# Patient Record
Sex: Male | Born: 1985 | Race: Black or African American | Hispanic: No | Marital: Single | Smoking: Never smoker
Health system: Southern US, Community
[De-identification: ages and names within clinical notes are randomized; demographics above are authoritative.]

## PROBLEM LIST (undated history)

## (undated) DIAGNOSIS — G8190 Hemiplegia, unspecified affecting unspecified side: Secondary | ICD-10-CM

## (undated) DIAGNOSIS — S069XAA Unspecified intracranial injury with loss of consciousness status unknown, initial encounter: Secondary | ICD-10-CM

## (undated) DIAGNOSIS — I10 Essential (primary) hypertension: Secondary | ICD-10-CM

## (undated) DIAGNOSIS — S069X9A Unspecified intracranial injury with loss of consciousness of unspecified duration, initial encounter: Secondary | ICD-10-CM

## (undated) DIAGNOSIS — R569 Unspecified convulsions: Secondary | ICD-10-CM

---

## 2018-02-19 DIAGNOSIS — T1490XA Injury, unspecified, initial encounter: Secondary | ICD-10-CM | POA: Insufficient documentation

## 2018-02-19 DIAGNOSIS — W3400XA Accidental discharge from unspecified firearms or gun, initial encounter: Secondary | ICD-10-CM | POA: Insufficient documentation

## 2018-02-20 DIAGNOSIS — S020XXB Fracture of vault of skull, initial encounter for open fracture: Secondary | ICD-10-CM | POA: Insufficient documentation

## 2018-02-20 DIAGNOSIS — G9389 Other specified disorders of brain: Secondary | ICD-10-CM | POA: Insufficient documentation

## 2018-02-20 DIAGNOSIS — S065XAA Traumatic subdural hemorrhage with loss of consciousness status unknown, initial encounter: Secondary | ICD-10-CM | POA: Insufficient documentation

## 2018-02-20 DIAGNOSIS — I609 Nontraumatic subarachnoid hemorrhage, unspecified: Secondary | ICD-10-CM | POA: Insufficient documentation

## 2018-03-01 DIAGNOSIS — J95821 Acute postprocedural respiratory failure: Secondary | ICD-10-CM | POA: Insufficient documentation

## 2018-04-12 DIAGNOSIS — R569 Unspecified convulsions: Secondary | ICD-10-CM | POA: Insufficient documentation

## 2019-01-28 ENCOUNTER — Emergency Department (HOSPITAL_COMMUNITY)
Admission: EM | Admit: 2019-01-28 | Discharge: 2019-01-28 | Disposition: A | Discharge status: Home / Self Care | Payer: Medicaid Other | Attending: Emergency Medicine | Admitting: Emergency Medicine

## 2019-01-28 ENCOUNTER — Encounter (HOSPITAL_COMMUNITY): Payer: Self-pay

## 2019-01-28 ENCOUNTER — Other Ambulatory Visit: Payer: Self-pay

## 2019-01-28 DIAGNOSIS — R569 Unspecified convulsions: Secondary | ICD-10-CM | POA: Insufficient documentation

## 2019-01-28 DIAGNOSIS — Z79899 Other long term (current) drug therapy: Secondary | ICD-10-CM | POA: Diagnosis not present

## 2019-01-28 DIAGNOSIS — I10 Essential (primary) hypertension: Secondary | ICD-10-CM | POA: Insufficient documentation

## 2019-01-28 HISTORY — DX: Essential (primary) hypertension: I10

## 2019-01-28 HISTORY — DX: Unspecified convulsions: R56.9

## 2019-01-28 LAB — CBC WITH DIFFERENTIAL/PLATELET
Abs Immature Granulocytes: 0.05 10*3/uL (ref 0.00–0.07)
Basophils Absolute: 0 10*3/uL (ref 0.0–0.1)
Basophils Relative: 0 %
Eosinophils Absolute: 0 10*3/uL (ref 0.0–0.5)
Eosinophils Relative: 0 %
HCT: 44.3 % (ref 39.0–52.0)
Hemoglobin: 14.2 g/dL (ref 13.0–17.0)
Immature Granulocytes: 0 %
Lymphocytes Relative: 16 %
Lymphs Abs: 2.2 10*3/uL (ref 0.7–4.0)
MCH: 26.9 pg (ref 26.0–34.0)
MCHC: 32.1 g/dL (ref 30.0–36.0)
MCV: 83.9 fL (ref 80.0–100.0)
Monocytes Absolute: 1 10*3/uL (ref 0.1–1.0)
Monocytes Relative: 7 %
Neutro Abs: 10.4 10*3/uL — ABNORMAL HIGH (ref 1.7–7.7)
Neutrophils Relative %: 77 %
Platelets: 339 10*3/uL (ref 150–400)
RBC: 5.28 MIL/uL (ref 4.22–5.81)
RDW: 12.6 % (ref 11.5–15.5)
WBC: 13.7 10*3/uL — ABNORMAL HIGH (ref 4.0–10.5)
nRBC: 0 % (ref 0.0–0.2)

## 2019-01-28 LAB — BASIC METABOLIC PANEL
Anion gap: 14 (ref 5–15)
BUN: 12 mg/dL (ref 6–20)
CO2: 19 mmol/L — ABNORMAL LOW (ref 22–32)
Calcium: 9.1 mg/dL (ref 8.9–10.3)
Chloride: 103 mmol/L (ref 98–111)
Creatinine, Ser: 0.88 mg/dL (ref 0.61–1.24)
GFR calc Af Amer: >60 mL/min (ref >60)
GFR calc non Af Amer: >60 mL/min (ref >60)
Glucose, Bld: 96 mg/dL (ref 70–99)
Potassium: 4.8 mmol/L (ref 3.5–5.1)
Sodium: 136 mmol/L (ref 135–145)

## 2019-01-28 LAB — CBG MONITORING, ED: Glucose-Capillary: 102 mg/dL — ABNORMAL HIGH (ref 70–99)

## 2019-01-28 MED ORDER — LEVETIRACETAM IN NACL 1000 MG/100ML IV SOLN
1000.0000 mg | Freq: Once | INTRAVENOUS | Status: AC
  Administered 2019-01-28: 1000 mg via INTRAVENOUS
  Filled 2019-01-28: qty 100
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
## 2019-01-28 NOTE — ED Notes (Signed)
Called ptar for transport to [facility]

## 2019-01-31 LAB — LEVETIRACETAM LEVEL: Levetiracetam Lvl: <1 ug/mL — ABNORMAL LOW (ref 10.0–40.0)

## 2019-05-27 ENCOUNTER — Emergency Department (HOSPITAL_COMMUNITY)
Admission: EM | Admit: 2019-05-27 | Discharge: 2019-05-28 | Disposition: A | Discharge status: Home / Self Care | Payer: Medicaid Other | Attending: Emergency Medicine | Admitting: Emergency Medicine

## 2019-05-27 ENCOUNTER — Emergency Department (HOSPITAL_COMMUNITY): Payer: Medicaid Other

## 2019-05-27 ENCOUNTER — Other Ambulatory Visit: Payer: Self-pay

## 2019-05-27 ENCOUNTER — Encounter (HOSPITAL_COMMUNITY): Payer: Self-pay | Admitting: Emergency Medicine

## 2019-05-27 DIAGNOSIS — I10 Essential (primary) hypertension: Secondary | ICD-10-CM | POA: Insufficient documentation

## 2019-05-27 DIAGNOSIS — R569 Unspecified convulsions: Secondary | ICD-10-CM | POA: Insufficient documentation

## 2019-05-27 DIAGNOSIS — Z79899 Other long term (current) drug therapy: Secondary | ICD-10-CM | POA: Diagnosis not present

## 2019-05-27 LAB — CBC WITH DIFFERENTIAL/PLATELET
Abs Immature Granulocytes: 0.03 10*3/uL (ref 0.00–0.07)
Basophils Absolute: 0 10*3/uL (ref 0.0–0.1)
Basophils Relative: 0 %
Eosinophils Absolute: 0.1 10*3/uL (ref 0.0–0.5)
Eosinophils Relative: 1 %
HCT: 51.3 % (ref 39.0–52.0)
Hemoglobin: 16.2 g/dL (ref 13.0–17.0)
Immature Granulocytes: 0 %
Lymphocytes Relative: 43 %
Lymphs Abs: 4.2 10*3/uL — ABNORMAL HIGH (ref 0.7–4.0)
MCH: 26.5 pg (ref 26.0–34.0)
MCHC: 31.6 g/dL (ref 30.0–36.0)
MCV: 83.8 fL (ref 80.0–100.0)
Monocytes Absolute: 0.9 10*3/uL (ref 0.1–1.0)
Monocytes Relative: 9 %
Neutro Abs: 4.5 10*3/uL (ref 1.7–7.7)
Neutrophils Relative %: 47 %
Platelets: 346 10*3/uL (ref 150–400)
RBC: 6.12 MIL/uL — ABNORMAL HIGH (ref 4.22–5.81)
RDW: 13.2 % (ref 11.5–15.5)
WBC: 9.8 10*3/uL (ref 4.0–10.5)
nRBC: 0 % (ref 0.0–0.2)

## 2019-05-27 LAB — COMPREHENSIVE METABOLIC PANEL
ALT: 11 U/L (ref 0–44)
AST: 10 U/L — ABNORMAL LOW (ref 15–41)
Albumin: 4.2 g/dL (ref 3.5–5.0)
Alkaline Phosphatase: 64 U/L (ref 38–126)
Anion gap: 9 (ref 5–15)
BUN: 13 mg/dL (ref 6–20)
CO2: 25 mmol/L (ref 22–32)
Calcium: 9.2 mg/dL (ref 8.9–10.3)
Chloride: 106 mmol/L (ref 98–111)
Creatinine, Ser: 0.92 mg/dL (ref 0.61–1.24)
GFR calc Af Amer: >60 mL/min (ref >60)
GFR calc non Af Amer: >60 mL/min (ref >60)
Glucose, Bld: 101 mg/dL — ABNORMAL HIGH (ref 70–99)
Potassium: 3.6 mmol/L (ref 3.5–5.1)
Sodium: 140 mmol/L (ref 135–145)
Total Bilirubin: 0.3 mg/dL (ref 0.3–1.2)
Total Protein: 7.5 g/dL (ref 6.5–8.1)

## 2019-05-27 MED ORDER — LEVETIRACETAM IN NACL 1000 MG/100ML IV SOLN
1000.0000 mg | Freq: Once | INTRAVENOUS | Status: AC
  Administered 2019-05-27: 22:00:00 1000 mg via INTRAVENOUS
  Filled 2019-05-27: qty 100
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
## 2019-05-29 LAB — CBG MONITORING, ED: Glucose-Capillary: 104 mg/dL — ABNORMAL HIGH (ref 70–99)

## 2019-05-30 LAB — LEVETIRACETAM LEVEL: Levetiracetam Lvl: 3.7 ug/mL — ABNORMAL LOW (ref 10.0–40.0)

## 2019-06-27 ENCOUNTER — Ambulatory Visit: Payer: Medicaid Other | Admitting: Neurology

## 2019-06-27 DIAGNOSIS — G96198 Other disorders of meninges, not elsewhere classified: Secondary | ICD-10-CM | POA: Insufficient documentation

## 2019-06-27 DIAGNOSIS — R1310 Dysphagia, unspecified: Secondary | ICD-10-CM | POA: Insufficient documentation

## 2019-08-04 ENCOUNTER — Other Ambulatory Visit: Payer: Self-pay

## 2019-08-04 ENCOUNTER — Encounter (HOSPITAL_COMMUNITY): Payer: Self-pay

## 2019-08-04 ENCOUNTER — Emergency Department (HOSPITAL_COMMUNITY)
Admission: EM | Admit: 2019-08-04 | Discharge: 2019-08-04 | Disposition: A | Discharge status: Home / Self Care | Payer: Medicaid Other | Attending: Emergency Medicine | Admitting: Emergency Medicine

## 2019-08-04 DIAGNOSIS — I10 Essential (primary) hypertension: Secondary | ICD-10-CM | POA: Diagnosis not present

## 2019-08-04 DIAGNOSIS — Z9119 Patient's noncompliance with other medical treatment and regimen: Secondary | ICD-10-CM | POA: Diagnosis not present

## 2019-08-04 DIAGNOSIS — Z79899 Other long term (current) drug therapy: Secondary | ICD-10-CM | POA: Diagnosis not present

## 2019-08-04 DIAGNOSIS — R569 Unspecified convulsions: Secondary | ICD-10-CM | POA: Diagnosis present

## 2019-08-04 DIAGNOSIS — Z9114 Patient's other noncompliance with medication regimen: Secondary | ICD-10-CM

## 2019-08-04 LAB — CBG MONITORING, ED: Glucose-Capillary: 99 mg/dL (ref 70–99)

## 2019-08-04 MED ORDER — LEVETIRACETAM 500 MG PO TABS
1000.0000 mg | ORAL_TABLET | Freq: Once | ORAL | Status: AC
  Administered 2019-08-04: 1000 mg via ORAL
  Filled 2019-08-04: qty 2

## 2019-08-04 MED ORDER — POLYETHYLENE GLYCOL 3350 17 GM/SCOOP PO POWD
17.0000 g | Freq: Two times a day (BID) | ORAL | 0 refills | Status: AC
Start: 2019-08-04 — End: ?

## 2019-08-04 MED ORDER — LEVETIRACETAM IN NACL 1000 MG/100ML IV SOLN
1000.0000 mg | Freq: Once | INTRAVENOUS | Status: DC
  Filled 2019-08-04: qty 100

## 2019-08-04 NOTE — Discharge Instructions (Addendum)
No emergent condition was identified in the ER today.  If something should change or worsen, please return to the ER or follow-up with your doctor.   Take all of your medications as prescribed.

## 2019-08-04 NOTE — ED Triage Notes (Signed)
BIB EMS from [facility]. Hx of TBI. Facility reports pt has been "pocketing all medications"  A/O x1 ,person, at baseline. Pt had seizure "yesterday" per facility. Facility reports pt has complained of abd pain.   180/100 99 18 99% 98.4  cbg 100

## 2019-08-04 NOTE — ED Notes (Signed)
Unsuccessfully attempted to call report to facility x2.
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
## 2019-08-04 NOTE — ED Notes (Signed)
unsuccessful IV and blood draw attempt.

## 2019-08-04 NOTE — ED Notes (Signed)
Iv team at bedside  

## 2019-08-04 NOTE — ED Notes (Signed)
Pt refusing to let IV team start an IV at this time.

## 2019-08-04 NOTE — ED Notes (Signed)
Ptar called paper work printed.

## 2019-08-15 ENCOUNTER — Other Ambulatory Visit: Payer: Self-pay

## 2019-08-15 ENCOUNTER — Emergency Department (HOSPITAL_COMMUNITY)
Admission: EM | Admit: 2019-08-15 | Discharge: 2019-08-16 | Disposition: A | Discharge status: Skilled Nursing Facility | Payer: Medicaid Other | Attending: Emergency Medicine | Admitting: Emergency Medicine

## 2019-08-15 ENCOUNTER — Encounter (HOSPITAL_COMMUNITY): Payer: Self-pay | Admitting: *Deleted

## 2019-08-15 DIAGNOSIS — G40919 Epilepsy, unspecified, intractable, without status epilepticus: Secondary | ICD-10-CM

## 2019-08-15 DIAGNOSIS — R569 Unspecified convulsions: Secondary | ICD-10-CM | POA: Diagnosis not present

## 2019-08-15 DIAGNOSIS — Z87820 Personal history of traumatic brain injury: Secondary | ICD-10-CM | POA: Diagnosis not present

## 2019-08-15 DIAGNOSIS — I10 Essential (primary) hypertension: Secondary | ICD-10-CM | POA: Insufficient documentation

## 2019-08-15 DIAGNOSIS — Z79899 Other long term (current) drug therapy: Secondary | ICD-10-CM | POA: Diagnosis not present

## 2019-08-15 DIAGNOSIS — R4182 Altered mental status, unspecified: Secondary | ICD-10-CM | POA: Diagnosis present

## 2019-08-15 HISTORY — DX: Hemiplegia, unspecified affecting unspecified side: G81.90

## 2019-08-15 HISTORY — DX: Unspecified intracranial injury with loss of consciousness of unspecified duration, initial encounter: S06.9X9A

## 2019-08-15 HISTORY — DX: Unspecified intracranial injury with loss of consciousness status unknown, initial encounter: S06.9XAA

## 2019-08-15 LAB — CBG MONITORING, ED: Glucose-Capillary: 88 mg/dL (ref 70–99)

## 2019-08-15 MED ORDER — LEVETIRACETAM 500 MG PO TABS
1500.0000 mg | ORAL_TABLET | Freq: Once | ORAL | Status: AC
  Administered 2019-08-15: 1500 mg via ORAL
  Filled 2019-08-15: qty 3

## 2019-08-15 NOTE — Discharge Instructions (Signed)
Follow up with your family doc or neurologist.  They may want to change your medicaions.
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
## 2019-09-29 ENCOUNTER — Ambulatory Visit: Payer: Medicaid Other | Admitting: Neurology

## 2019-10-26 ENCOUNTER — Emergency Department (HOSPITAL_COMMUNITY)
Admission: EM | Admit: 2019-10-26 | Discharge: 2019-10-27 | Disposition: A | Discharge status: Home / Self Care | Payer: Medicaid Other | Attending: Emergency Medicine | Admitting: Emergency Medicine

## 2019-10-26 ENCOUNTER — Other Ambulatory Visit: Payer: Self-pay

## 2019-10-26 DIAGNOSIS — G40919 Epilepsy, unspecified, intractable, without status epilepticus: Secondary | ICD-10-CM | POA: Insufficient documentation

## 2019-10-26 DIAGNOSIS — I10 Essential (primary) hypertension: Secondary | ICD-10-CM | POA: Insufficient documentation

## 2019-10-26 LAB — CBC WITH DIFFERENTIAL/PLATELET
Abs Immature Granulocytes: 0.04 10*3/uL (ref 0.00–0.07)
Basophils Absolute: 0 10*3/uL (ref 0.0–0.1)
Basophils Relative: 0 %
Eosinophils Absolute: 0.1 10*3/uL (ref 0.0–0.5)
Eosinophils Relative: 1 %
HCT: 53.2 % — ABNORMAL HIGH (ref 39.0–52.0)
Hemoglobin: 16.9 g/dL (ref 13.0–17.0)
Immature Granulocytes: 0 %
Lymphocytes Relative: 30 %
Lymphs Abs: 4.2 10*3/uL — ABNORMAL HIGH (ref 0.7–4.0)
MCH: 26.5 pg (ref 26.0–34.0)
MCHC: 31.8 g/dL (ref 30.0–36.0)
MCV: 83.4 fL (ref 80.0–100.0)
Monocytes Absolute: 1.1 10*3/uL — ABNORMAL HIGH (ref 0.1–1.0)
Monocytes Relative: 8 %
Neutro Abs: 8.2 10*3/uL — ABNORMAL HIGH (ref 1.7–7.7)
Neutrophils Relative %: 61 %
Platelets: 421 10*3/uL — ABNORMAL HIGH (ref 150–400)
RBC: 6.38 MIL/uL — ABNORMAL HIGH (ref 4.22–5.81)
RDW: 14.6 % (ref 11.5–15.5)
WBC: 13.7 10*3/uL — ABNORMAL HIGH (ref 4.0–10.5)
nRBC: 0 % (ref 0.0–0.2)

## 2019-10-26 LAB — BASIC METABOLIC PANEL
Anion gap: 10 (ref 5–15)
BUN: 14 mg/dL (ref 6–20)
CO2: 26 mmol/L (ref 22–32)
Calcium: 9.6 mg/dL (ref 8.9–10.3)
Chloride: 104 mmol/L (ref 98–111)
Creatinine, Ser: 0.92 mg/dL (ref 0.61–1.24)
GFR calc Af Amer: >60 mL/min (ref >60)
GFR calc non Af Amer: >60 mL/min (ref >60)
Glucose, Bld: 106 mg/dL — ABNORMAL HIGH (ref 70–99)
Potassium: 4.2 mmol/L (ref 3.5–5.1)
Sodium: 140 mmol/L (ref 135–145)

## 2019-10-26 LAB — VALPROIC ACID LEVEL: Valproic Acid Lvl: 13 ug/mL — ABNORMAL LOW (ref 50.0–100.0)

## 2019-10-26 MED ORDER — LEVETIRACETAM IN NACL 1500 MG/100ML IV SOLN
1500.0000 mg | Freq: Once | INTRAVENOUS | Status: AC
  Administered 2019-10-26: 1500 mg via INTRAVENOUS
  Filled 2019-10-26: qty 100
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
## 2019-10-26 NOTE — Discharge Instructions (Addendum)
You were seen in the emergency department for a breakthrough seizure.  Your lab work showed that your Depakote level was very low.  You were given an IV load of Keppra.  You had no further seizures here.  Please take your medications regular.  Follow-up with your doctor.  Return to the emergency department if any worsening or concerning symptoms
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
## 2019-10-28 LAB — LEVETIRACETAM LEVEL: Levetiracetam Lvl: 23.5 ug/mL (ref 10.0–40.0)

## 2019-10-29 ENCOUNTER — Inpatient Hospital Stay (HOSPITAL_COMMUNITY): Payer: Medicaid Other

## 2019-10-29 ENCOUNTER — Emergency Department (HOSPITAL_COMMUNITY): Payer: Medicaid Other

## 2019-10-29 ENCOUNTER — Other Ambulatory Visit: Payer: Self-pay

## 2019-10-29 ENCOUNTER — Inpatient Hospital Stay (HOSPITAL_COMMUNITY)
Admission: EM | Admit: 2019-10-29 | Discharge: 2019-11-07 | DRG: 100 | Disposition: A | Discharge status: Skilled Nursing Facility | Payer: Medicaid Other | Source: Skilled Nursing Facility | Attending: Internal Medicine | Admitting: Internal Medicine

## 2019-10-29 DIAGNOSIS — R29810 Facial weakness: Secondary | ICD-10-CM | POA: Diagnosis present

## 2019-10-29 DIAGNOSIS — R1311 Dysphagia, oral phase: Secondary | ICD-10-CM | POA: Diagnosis present

## 2019-10-29 DIAGNOSIS — Z9289 Personal history of other medical treatment: Secondary | ICD-10-CM

## 2019-10-29 DIAGNOSIS — R000 Tachycardia, unspecified: Secondary | ICD-10-CM | POA: Diagnosis present

## 2019-10-29 DIAGNOSIS — R569 Unspecified convulsions: Secondary | ICD-10-CM

## 2019-10-29 DIAGNOSIS — I10 Essential (primary) hypertension: Secondary | ICD-10-CM | POA: Diagnosis present

## 2019-10-29 DIAGNOSIS — T426X6A Underdosing of other antiepileptic and sedative-hypnotic drugs, initial encounter: Secondary | ICD-10-CM | POA: Diagnosis present

## 2019-10-29 DIAGNOSIS — L8990 Pressure ulcer of unspecified site, unspecified stage: Secondary | ICD-10-CM | POA: Diagnosis present

## 2019-10-29 DIAGNOSIS — E876 Hypokalemia: Secondary | ICD-10-CM | POA: Diagnosis not present

## 2019-10-29 DIAGNOSIS — A419 Sepsis, unspecified organism: Secondary | ICD-10-CM | POA: Diagnosis not present

## 2019-10-29 DIAGNOSIS — G40901 Epilepsy, unspecified, not intractable, with status epilepticus: Principal | ICD-10-CM | POA: Diagnosis present

## 2019-10-29 DIAGNOSIS — D72820 Lymphocytosis (symptomatic): Secondary | ICD-10-CM | POA: Diagnosis not present

## 2019-10-29 DIAGNOSIS — Z0189 Encounter for other specified special examinations: Secondary | ICD-10-CM

## 2019-10-29 DIAGNOSIS — T420X6A Underdosing of hydantoin derivatives, initial encounter: Secondary | ICD-10-CM | POA: Diagnosis present

## 2019-10-29 DIAGNOSIS — Z91148 Patient's other noncompliance with medication regimen for other reason: Secondary | ICD-10-CM

## 2019-10-29 DIAGNOSIS — I444 Left anterior fascicular block: Secondary | ICD-10-CM | POA: Diagnosis present

## 2019-10-29 DIAGNOSIS — G030 Nonpyogenic meningitis: Secondary | ICD-10-CM | POA: Diagnosis not present

## 2019-10-29 DIAGNOSIS — Z20822 Contact with and (suspected) exposure to covid-19: Secondary | ICD-10-CM | POA: Diagnosis present

## 2019-10-29 DIAGNOSIS — L89151 Pressure ulcer of sacral region, stage 1: Secondary | ICD-10-CM | POA: Diagnosis present

## 2019-10-29 DIAGNOSIS — R509 Fever, unspecified: Secondary | ICD-10-CM | POA: Diagnosis not present

## 2019-10-29 DIAGNOSIS — G8191 Hemiplegia, unspecified affecting right dominant side: Secondary | ICD-10-CM | POA: Diagnosis present

## 2019-10-29 DIAGNOSIS — R471 Dysarthria and anarthria: Secondary | ICD-10-CM | POA: Diagnosis present

## 2019-10-29 DIAGNOSIS — Z87820 Personal history of traumatic brain injury: Secondary | ICD-10-CM | POA: Diagnosis not present

## 2019-10-29 DIAGNOSIS — J690 Pneumonitis due to inhalation of food and vomit: Secondary | ICD-10-CM | POA: Diagnosis present

## 2019-10-29 DIAGNOSIS — Z91128 Patient's intentional underdosing of medication regimen for other reason: Secondary | ICD-10-CM

## 2019-10-29 DIAGNOSIS — Z79899 Other long term (current) drug therapy: Secondary | ICD-10-CM | POA: Diagnosis not present

## 2019-10-29 DIAGNOSIS — J9601 Acute respiratory failure with hypoxia: Secondary | ICD-10-CM | POA: Diagnosis present

## 2019-10-29 DIAGNOSIS — Z781 Physical restraint status: Secondary | ICD-10-CM | POA: Diagnosis not present

## 2019-10-29 DIAGNOSIS — Y92129 Unspecified place in nursing home as the place of occurrence of the external cause: Secondary | ICD-10-CM | POA: Diagnosis not present

## 2019-10-29 DIAGNOSIS — R9431 Abnormal electrocardiogram [ECG] [EKG]: Secondary | ICD-10-CM | POA: Diagnosis not present

## 2019-10-29 DIAGNOSIS — Z9114 Patient's other noncompliance with medication regimen: Secondary | ICD-10-CM

## 2019-10-29 DIAGNOSIS — G9349 Other encephalopathy: Secondary | ICD-10-CM | POA: Diagnosis present

## 2019-10-29 LAB — CBC WITH DIFFERENTIAL/PLATELET
Abs Immature Granulocytes: 0.26 10*3/uL — ABNORMAL HIGH (ref 0.00–0.07)
Basophils Absolute: 0.1 10*3/uL (ref 0.0–0.1)
Basophils Relative: 0 %
Eosinophils Absolute: 0 10*3/uL (ref 0.0–0.5)
Eosinophils Relative: 0 %
HCT: 49.8 % (ref 39.0–52.0)
Hemoglobin: 15.1 g/dL (ref 13.0–17.0)
Immature Granulocytes: 1 %
Lymphocytes Relative: 6 %
Lymphs Abs: 1.3 10*3/uL (ref 0.7–4.0)
MCH: 25.7 pg — ABNORMAL LOW (ref 26.0–34.0)
MCHC: 30.3 g/dL (ref 30.0–36.0)
MCV: 84.8 fL (ref 80.0–100.0)
Monocytes Absolute: 2.2 10*3/uL — ABNORMAL HIGH (ref 0.1–1.0)
Monocytes Relative: 11 %
Neutro Abs: 17.3 10*3/uL — ABNORMAL HIGH (ref 1.7–7.7)
Neutrophils Relative %: 82 %
Platelets: 327 10*3/uL (ref 150–400)
RBC: 5.87 MIL/uL — ABNORMAL HIGH (ref 4.22–5.81)
RDW: 13.8 % (ref 11.5–15.5)
WBC: 21.2 10*3/uL — ABNORMAL HIGH (ref 4.0–10.5)
nRBC: 0 % (ref 0.0–0.2)

## 2019-10-29 LAB — COMPREHENSIVE METABOLIC PANEL
ALT: 38 U/L (ref 0–44)
AST: 39 U/L (ref 15–41)
Albumin: 3.7 g/dL (ref 3.5–5.0)
Alkaline Phosphatase: 55 U/L (ref 38–126)
Anion gap: 10 (ref 5–15)
BUN: 9 mg/dL (ref 6–20)
CO2: 21 mmol/L — ABNORMAL LOW (ref 22–32)
Calcium: 8.5 mg/dL — ABNORMAL LOW (ref 8.9–10.3)
Chloride: 102 mmol/L (ref 98–111)
Creatinine, Ser: 1.11 mg/dL (ref 0.61–1.24)
GFR calc Af Amer: >60 mL/min (ref >60)
GFR calc non Af Amer: >60 mL/min (ref >60)
Glucose, Bld: 145 mg/dL — ABNORMAL HIGH (ref 70–99)
Potassium: 4.1 mmol/L (ref 3.5–5.1)
Sodium: 133 mmol/L — ABNORMAL LOW (ref 135–145)
Total Bilirubin: 0.8 mg/dL (ref 0.3–1.2)
Total Protein: 6.9 g/dL (ref 6.5–8.1)

## 2019-10-29 LAB — URINALYSIS, ROUTINE W REFLEX MICROSCOPIC
Bilirubin Urine: NEGATIVE
Glucose, UA: NEGATIVE mg/dL
Ketones, ur: NEGATIVE mg/dL
Leukocytes,Ua: NEGATIVE
Nitrite: NEGATIVE
Protein, ur: 100 mg/dL — AB
Specific Gravity, Urine: 1.021 (ref 1.005–1.030)
pH: 5 (ref 5.0–8.0)

## 2019-10-29 LAB — HEMOGLOBIN A1C
Hgb A1c MFr Bld: 5.6 % (ref 4.8–5.6)
Mean Plasma Glucose: 114.02 mg/dL

## 2019-10-29 LAB — I-STAT ARTERIAL BLOOD GAS, ED
Acid-base deficit: 1 mmol/L (ref 0.0–2.0)
Bicarbonate: 25.1 mmol/L (ref 20.0–28.0)
Calcium, Ion: 1.19 mmol/L (ref 1.15–1.40)
HCT: 49 % (ref 39.0–52.0)
Hemoglobin: 16.7 g/dL (ref 13.0–17.0)
O2 Saturation: 93 %
Patient temperature: 100.7
Potassium: 4 mmol/L (ref 3.5–5.1)
Sodium: 138 mmol/L (ref 135–145)
TCO2: 26 mmol/L (ref 22–32)
pCO2 arterial: 45.9 mmHg (ref 32.0–48.0)
pH, Arterial: 7.351 (ref 7.350–7.450)
pO2, Arterial: 76 mmHg — ABNORMAL LOW (ref 83.0–108.0)

## 2019-10-29 LAB — CBC
HCT: 43.5 % (ref 39.0–52.0)
Hemoglobin: 13.5 g/dL (ref 13.0–17.0)
MCH: 25.6 pg — ABNORMAL LOW (ref 26.0–34.0)
MCHC: 31 g/dL (ref 30.0–36.0)
MCV: 82.5 fL (ref 80.0–100.0)
Platelets: 258 10*3/uL (ref 150–400)
RBC: 5.27 MIL/uL (ref 4.22–5.81)
RDW: 14 % (ref 11.5–15.5)
WBC: 14.9 10*3/uL — ABNORMAL HIGH (ref 4.0–10.5)
nRBC: 0 % (ref 0.0–0.2)

## 2019-10-29 LAB — PROCALCITONIN: Procalcitonin: 1.86 ng/mL

## 2019-10-29 LAB — CK: Total CK: 255 U/L (ref 49–397)

## 2019-10-29 LAB — MAGNESIUM
Magnesium: 1.7 mg/dL (ref 1.7–2.4)
Magnesium: 1.3 mg/dL — ABNORMAL LOW (ref 1.7–2.4)

## 2019-10-29 LAB — LACTIC ACID, PLASMA: Lactic Acid, Venous: 1.8 mmol/L (ref 0.5–1.9)

## 2019-10-29 LAB — GLUCOSE, CAPILLARY
Glucose-Capillary: 76 mg/dL (ref 70–99)
Glucose-Capillary: 106 mg/dL — ABNORMAL HIGH (ref 70–99)

## 2019-10-29 LAB — PHOSPHORUS
Phosphorus: 4.1 mg/dL (ref 2.5–4.6)
Phosphorus: 2 mg/dL — ABNORMAL LOW (ref 2.5–4.6)

## 2019-10-29 LAB — TROPONIN I (HIGH SENSITIVITY)
Troponin I (High Sensitivity): 126 ng/L (ref <18)
Troponin I (High Sensitivity): 124 ng/L (ref <18)

## 2019-10-29 LAB — VALPROIC ACID LEVEL: Valproic Acid Lvl: <10 ug/mL — ABNORMAL LOW (ref 50.0–100.0)

## 2019-10-29 LAB — MRSA PCR SCREENING: MRSA by PCR: POSITIVE — AB

## 2019-10-29 LAB — SARS CORONAVIRUS 2 BY RT PCR (HOSPITAL ORDER, PERFORMED IN ~~LOC~~ HOSPITAL LAB): SARS Coronavirus 2: NEGATIVE

## 2019-10-29 LAB — CBG MONITORING, ED: Glucose-Capillary: 101 mg/dL — ABNORMAL HIGH (ref 70–99)

## 2019-10-29 MED ORDER — ONDANSETRON HCL 4 MG/2ML IJ SOLN
4.0000 mg | Freq: Four times a day (QID) | INTRAMUSCULAR | Status: DC | PRN
  Administered 2019-11-02: 4 mg via INTRAVENOUS
  Filled 2019-10-29: qty 2

## 2019-10-29 MED ORDER — PROPOFOL 1000 MG/100ML IV EMUL
5.0000 ug/kg/min | INTRAVENOUS | Status: DC
  Administered 2019-10-29 – 2019-10-30 (×3): 45 ug/kg/min via INTRAVENOUS
  Administered 2019-10-30: 35 ug/kg/min via INTRAVENOUS
  Administered 2019-10-30: 46.6922 ug/kg/min via INTRAVENOUS
  Administered 2019-10-31 (×7): 50 ug/kg/min via INTRAVENOUS
  Administered 2019-11-01: 40 ug/kg/min via INTRAVENOUS
  Administered 2019-11-01: 25 ug/kg/min via INTRAVENOUS
  Filled 2019-10-29 (×2): qty 100
  Filled 2019-10-29: qty 200
  Filled 2019-10-29 (×6): qty 100
  Filled 2019-10-29: qty 200
  Filled 2019-10-29: qty 300
  Filled 2019-10-29 (×4): qty 100
  Filled 2019-10-29: qty 200
  Filled 2019-10-29: qty 100

## 2019-10-29 MED ORDER — LORAZEPAM 2 MG/ML IJ SOLN: 2.0000 mg | Freq: Once | INTRAMUSCULAR | Status: AC

## 2019-10-29 MED ORDER — VANCOMYCIN HCL 2000 MG/400ML IV SOLN
2000.0000 mg | Freq: Once | INTRAVENOUS | Status: AC
  Administered 2019-10-29: 2000 mg via INTRAVENOUS
  Filled 2019-10-29: qty 400

## 2019-10-29 MED ORDER — FENTANYL CITRATE (PF) 100 MCG/2ML IJ SOLN
INTRAMUSCULAR | Status: AC
  Filled 2019-10-29: qty 2

## 2019-10-29 MED ORDER — ACETAMINOPHEN 160 MG/5ML PO SOLN
650.0000 mg | ORAL | Status: DC | PRN
  Filled 2019-10-29: qty 20.3

## 2019-10-29 MED ORDER — DOCUSATE SODIUM 50 MG/5ML PO LIQD
100.0000 mg | Freq: Two times a day (BID) | ORAL | Status: DC
  Filled 2019-10-29: qty 10

## 2019-10-29 MED ORDER — DIVALPROEX SODIUM 125 MG PO DR TAB
125.0000 mg | DELAYED_RELEASE_TABLET | Freq: Two times a day (BID) | ORAL | Status: DC
  Filled 2019-10-29: qty 1

## 2019-10-29 MED ORDER — SODIUM CHLORIDE 0.9 % IV BOLUS
1500.0000 mL | Freq: Once | INTRAVENOUS | Status: AC
  Administered 2019-10-29: 1500 mL via INTRAVENOUS

## 2019-10-29 MED ORDER — VALPROIC ACID 250 MG/5ML PO SOLN
125.0000 mg | Freq: Two times a day (BID) | ORAL | Status: DC
  Administered 2019-10-29 – 2019-10-31 (×5): 125 mg
  Filled 2019-10-29 (×5): qty 5

## 2019-10-29 MED ORDER — FENTANYL CITRATE (PF) 100 MCG/2ML IJ SOLN
INTRAMUSCULAR | Status: DC | PRN
  Administered 2019-10-29: 100 ug via INTRAVENOUS

## 2019-10-29 MED ORDER — ETOMIDATE 2 MG/ML IV SOLN
INTRAVENOUS | Status: DC | PRN
  Administered 2019-10-29: 30 mg via INTRAVENOUS

## 2019-10-29 MED ORDER — METRONIDAZOLE IN NACL 5-0.79 MG/ML-% IV SOLN
500.0000 mg | Freq: Once | INTRAVENOUS | Status: DC
  Filled 2019-10-29: qty 100

## 2019-10-29 MED ORDER — SODIUM CHLORIDE 0.9 % IV SOLN
2.0000 g | Freq: Once | INTRAVENOUS | Status: AC
  Administered 2019-10-29: 2 g via INTRAVENOUS
  Filled 2019-10-29: qty 2

## 2019-10-29 MED ORDER — VANCOMYCIN HCL IN DEXTROSE 1-5 GM/200ML-% IV SOLN
1000.0000 mg | Freq: Three times a day (TID) | INTRAVENOUS | Status: DC
  Administered 2019-10-29 – 2019-10-31 (×5): 1000 mg via INTRAVENOUS
  Filled 2019-10-29 (×5): qty 200

## 2019-10-29 MED ORDER — METRONIDAZOLE IN NACL 5-0.79 MG/ML-% IV SOLN
500.0000 mg | Freq: Once | INTRAVENOUS | Status: AC
  Administered 2019-10-29: 500 mg via INTRAVENOUS
  Filled 2019-10-29: qty 100

## 2019-10-29 MED ORDER — METOPROLOL TARTRATE 5 MG/5ML IV SOLN: 5.0000 mg | INTRAVENOUS | Status: DC | PRN

## 2019-10-29 MED ORDER — LACTATED RINGERS IV BOLUS
1500.0000 mL | Freq: Once | INTRAVENOUS | Status: AC
  Administered 2019-10-29: 1500 mL via INTRAVENOUS

## 2019-10-29 MED ORDER — MUPIROCIN 2 % EX OINT
1.0000 "application " | TOPICAL_OINTMENT | Freq: Two times a day (BID) | CUTANEOUS | Status: AC
  Administered 2019-10-29 – 2019-11-03 (×10): 1 via NASAL
  Filled 2019-10-29 (×3): qty 22

## 2019-10-29 MED ORDER — SUCCINYLCHOLINE CHLORIDE 20 MG/ML IJ SOLN
INTRAMUSCULAR | Status: DC | PRN
  Administered 2019-10-29: 150 mg via INTRAVENOUS

## 2019-10-29 MED ORDER — FREE WATER
200.0000 mL | Freq: Four times a day (QID) | Status: DC
  Administered 2019-10-29 – 2019-10-31 (×7): 200 mL

## 2019-10-29 MED ORDER — LEVETIRACETAM IN NACL 1500 MG/100ML IV SOLN
1500.0000 mg | Freq: Once | INTRAVENOUS | Status: AC
  Administered 2019-10-29: 1500 mg via INTRAVENOUS
  Filled 2019-10-29: qty 100

## 2019-10-29 MED ORDER — ACETAMINOPHEN 160 MG/5ML PO SOLN
650.0000 mg | Freq: Four times a day (QID) | ORAL | Status: DC | PRN
  Administered 2019-10-29: 650 mg via ORAL
  Filled 2019-10-29: qty 20.3

## 2019-10-29 MED ORDER — VANCOMYCIN HCL IN DEXTROSE 1-5 GM/200ML-% IV SOLN: 1000.0000 mg | Freq: Once | INTRAVENOUS | Status: DC

## 2019-10-29 MED ORDER — IBUPROFEN 100 MG/5ML PO SUSP
400.0000 mg | Freq: Four times a day (QID) | ORAL | Status: DC | PRN
  Filled 2019-10-29: qty 20

## 2019-10-29 MED ORDER — PROSOURCE TF PO LIQD
45.0000 mL | Freq: Two times a day (BID) | ORAL | Status: DC
  Administered 2019-10-29 – 2019-10-31 (×4): 45 mL
  Filled 2019-10-29 (×4): qty 45

## 2019-10-29 MED ORDER — PROPOFOL 1000 MG/100ML IV EMUL
INTRAVENOUS | Status: AC
  Administered 2019-10-29: 10 ug/kg/min via INTRAVENOUS
  Filled 2019-10-29: qty 100

## 2019-10-29 MED ORDER — ORAL CARE MOUTH RINSE
15.0000 mL | OROMUCOSAL | Status: DC
  Administered 2019-10-29 – 2019-11-07 (×85): 15 mL via OROMUCOSAL

## 2019-10-29 MED ORDER — IBUPROFEN 100 MG/5ML PO SUSP
400.0000 mg | Freq: Four times a day (QID) | ORAL | Status: DC | PRN
  Administered 2019-10-29 – 2019-11-05 (×5): 400 mg
  Filled 2019-10-29 (×6): qty 20

## 2019-10-29 MED ORDER — INSULIN ASPART 100 UNIT/ML ~~LOC~~ SOLN
0.0000 [IU] | SUBCUTANEOUS | Status: DC
  Administered 2019-10-30 – 2019-11-07 (×15): 2 [IU] via SUBCUTANEOUS

## 2019-10-29 MED ORDER — DOCUSATE SODIUM 50 MG/5ML PO LIQD
100.0000 mg | Freq: Two times a day (BID) | ORAL | Status: DC
  Administered 2019-10-30 – 2019-11-05 (×10): 100 mg
  Filled 2019-10-29 (×13): qty 10

## 2019-10-29 MED ORDER — SODIUM CHLORIDE 0.9 % IV SOLN
2.0000 g | Freq: Three times a day (TID) | INTRAVENOUS | Status: DC
  Filled 2019-10-29 (×2): qty 2

## 2019-10-29 MED ORDER — FENTANYL CITRATE (PF) 100 MCG/2ML IJ SOLN
100.0000 ug | Freq: Once | INTRAMUSCULAR | Status: AC
  Administered 2019-10-29: 100 ug via INTRAVENOUS

## 2019-10-29 MED ORDER — ACETAMINOPHEN 160 MG/5ML PO SOLN
650.0000 mg | ORAL | Status: DC | PRN
  Administered 2019-10-29 – 2019-11-02 (×5): 650 mg
  Filled 2019-10-29 (×4): qty 20.3

## 2019-10-29 MED ORDER — POLYETHYLENE GLYCOL 3350 17 G PO PACK: 17.0000 g | PACK | Freq: Every day | ORAL | Status: DC | PRN

## 2019-10-29 MED ORDER — CHLORHEXIDINE GLUCONATE 0.12% ORAL RINSE (MEDLINE KIT)
15.0000 mL | Freq: Two times a day (BID) | OROMUCOSAL | Status: DC
  Administered 2019-10-29 – 2019-11-07 (×18): 15 mL via OROMUCOSAL

## 2019-10-29 MED ORDER — POLYETHYLENE GLYCOL 3350 17 G PO PACK: 17.0000 g | PACK | Freq: Every day | ORAL | Status: DC

## 2019-10-29 MED ORDER — POLYETHYLENE GLYCOL 3350 17 G PO PACK
17.0000 g | PACK | Freq: Every day | ORAL | Status: DC
  Administered 2019-10-31 – 2019-11-05 (×4): 17 g
  Filled 2019-10-29 (×6): qty 1

## 2019-10-29 MED ORDER — PANTOPRAZOLE SODIUM 40 MG IV SOLR
40.0000 mg | Freq: Every day | INTRAVENOUS | Status: DC
  Administered 2019-10-29 – 2019-10-30 (×2): 40 mg via INTRAVENOUS
  Filled 2019-10-29 (×2): qty 40

## 2019-10-29 MED ORDER — FENTANYL CITRATE (PF) 100 MCG/2ML IJ SOLN
50.0000 ug | INTRAMUSCULAR | Status: DC | PRN
  Administered 2019-10-29 – 2019-11-01 (×4): 100 ug via INTRAVENOUS
  Filled 2019-10-29 (×3): qty 2

## 2019-10-29 MED ORDER — VITAL HIGH PROTEIN PO LIQD
1000.0000 mL | ORAL | Status: DC
  Administered 2019-10-29 – 2019-10-30 (×3): 1000 mL

## 2019-10-29 MED ORDER — CHLORHEXIDINE GLUCONATE CLOTH 2 % EX PADS
6.0000 | MEDICATED_PAD | Freq: Every day | CUTANEOUS | Status: DC
  Administered 2019-11-01 – 2019-11-07 (×8): 6 via TOPICAL

## 2019-10-29 MED ORDER — LORAZEPAM 2 MG/ML IJ SOLN
INTRAMUSCULAR | Status: AC
  Administered 2019-10-29: 2 mg via INTRAVENOUS
  Filled 2019-10-29: qty 1

## 2019-10-29 MED ORDER — HEPARIN SODIUM (PORCINE) 5000 UNIT/ML IJ SOLN
5000.0000 [IU] | Freq: Three times a day (TID) | INTRAMUSCULAR | Status: DC
  Administered 2019-10-29 – 2019-11-07 (×25): 5000 [IU] via SUBCUTANEOUS
  Filled 2019-10-29 (×25): qty 1

## 2019-10-29 MED ORDER — DOCUSATE SODIUM 100 MG PO CAPS: 100.0000 mg | ORAL_CAPSULE | Freq: Two times a day (BID) | ORAL | Status: DC | PRN

## 2019-10-29 MED ORDER — PIPERACILLIN-TAZOBACTAM 3.375 G IVPB
3.3750 g | Freq: Three times a day (TID) | INTRAVENOUS | Status: DC
  Administered 2019-10-29 – 2019-10-31 (×5): 3.375 g via INTRAVENOUS
  Filled 2019-10-29 (×5): qty 50

## 2019-10-29 MED ORDER — FENTANYL CITRATE (PF) 100 MCG/2ML IJ SOLN: 50.0000 ug | INTRAMUSCULAR | Status: DC | PRN
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
## 2019-10-29 NOTE — Progress Notes (Deleted)
Mother notified by phone while patient in ED, not physically present as lives 3 hours away

## 2019-10-29 NOTE — Progress Notes (Signed)
Pharmacy Antibiotic Note  Matthew Glenn is a 34 y.o. male admitted on 10/29/2019 with sepsis.  Pharmacy has been consulted for Vancomycin and Cefepime dosing.  Plan: Cefepime IV 2g Q8h Vancomycin IV 2g loading dose  Then vancomycin IV 1000mg  Q8h  Nomogram based dosing with target trough of 15-20  Monitor vancomycin troughs, SCr and clinical symptoms Follow up with cultures  Height: 6' (182.9 cm) Weight: (!) 104 kg (229 lb 4.5 oz) IBW/kg (Calculated) : 77.6   Temp (24hrs), Avg:100.7 F (38.2 C), Min:100.6 F (38.1 C), Max:100.8 F (38.2 C)  Recent Labs  Lab 10/26/19 2316 10/29/19 0938  WBC 13.7* 21.2*  CREATININE 0.92 1.11    Estimated Creatinine Clearance: 119.2 mL/min (by C-G formula based on SCr of 1.11 mg/dL).    No Known Allergies  Thank you for allowing pharmacy to be a part of this patient's care.  10/31/19, PharmD PGY1 Pharmacy Resident 10/29/2019 10:31 AM

## 2019-10-29 NOTE — Progress Notes (Signed)
RT note: RT and RN transported vent patient from ED to 4N #20. Vital signs stable through out.

## 2019-10-29 NOTE — Progress Notes (Addendum)
CRITICAL VALUE ALERT  Critical Value:  Troponin 126  Date & Time Notied:  10/29/2019 @ 1723  Provider Notified: Raymon Mutton, CCM NP  Orders Received/Actions taken: will trend troponins per provider

## 2019-10-29 NOTE — Progress Notes (Signed)
.  eeg was disconnected for transport to 4N20. Now up and running test button pressed

## 2019-10-29 NOTE — Progress Notes (Signed)
RT note: RT and RN transported vent patient to CT and back to ED. Vital signs stable through out. 

## 2019-10-29 NOTE — Progress Notes (Signed)
OG tube advanced 3cm per radiology recommendation, now measures 61 cm at the lip.  Aris Lot, RN
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
## 2019-10-29 NOTE — Progress Notes (Signed)
Stat LTM EEG hooked up and running - no initial skin breakdown - push button tested - neuro notified. °

## 2019-10-29 NOTE — ED Notes (Signed)
Mother of pt, [place] Rowe, updated.

## 2019-10-29 NOTE — Progress Notes (Signed)
Pharmacy Antibiotic Note  Matthew Glenn is a 34 y.o. male admitted on 10/29/2019 with sepsis.  Pharmacy has been consulted for pip/tazo dosing. Changing from Cefepime to pip/tazo for less seizure effect.   Good renal fx. WBC 21, Tmax 102.3. HR 110s. Cultures pending.   Plan: Start pip/tazo 3.375g Q8 hr (EI) Stop cefepime Continue vancomycin 1g Q8 hr  Monitor cultures, clinical status, renal fx, vancomycin levels prn  Narrow abx as able and f/u duration    Height: 6' (182.9 cm) Weight: (!) 104 kg (229 lb 4.5 oz) IBW/kg (Calculated) : 77.6  Temp (24hrs), Avg:101.1 F (38.4 C), Min:100.6 F (38.1 C), Max:102.3 F (39.1 C)  Recent Labs  Lab 10/26/19 2316 10/29/19 0938  WBC 13.7* 21.2*  CREATININE 0.92 1.11  LATICACIDVEN  --  1.8    Estimated Creatinine Clearance: 119.2 mL/min (by C-G formula based on SCr of 1.11 mg/dL).    No Known Allergies  Antimicrobials this admission: piptazo 7/24 >>  Cefe  7/24 Vanc 7/24 >> MTZ 7/24 >>    Microbiology results: 7/24 BCx: pend 7/24 Sputum: pend  7/24 MRSA PCR: pend  Thank you for allowing pharmacy to be a part of this patient's care.  Alphia Moh, PharmD, BCPS, BCCP Clinical Pharmacist  Please check AMION for all [facility] Pharmacy phone numbers After 10:00 PM, call Main Pharmacy [phone]
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
## 2019-10-29 NOTE — ED Notes (Signed)
CCM at bedside 

## 2019-10-29 NOTE — Progress Notes (Signed)
PCCM Progress Note  Repeat EKG afternoon of 7/24 revealed possible EKG changes including questionable ST elevation in V2.  Given associated tachycardia cardiology consult was called.  Discussed EKG findings with cardiology and decision was made to pursue troponin trend and echocardiogram prior to official cardiology consultation.  Cardiologist Dr. Cristal Deer did review EKGs over the phone with me and agreed no acute concerning EKG findings were identified.  We will pursue troponin trend and echocardiogram, depending on results we will formally consult cardiology if needed  Matthew Gant, NP-C [facility] Contact / Pager information can be found on Amion  10/29/2019, 4:09 PM

## 2019-10-29 NOTE — ED Notes (Signed)
Requested tylenol verification from pharmacy

## 2019-10-29 NOTE — Progress Notes (Deleted)
Joseph Pierini, patient's daughter and decision maker, to update her about intubation.  Voice mailbox was full.

## 2019-10-29 NOTE — ED Triage Notes (Signed)
PT here from [facility] via [facility] after seizure that did not stop with 2 mg ativan.  Pt actively seizing upon ems arrival.  They gave a total of 10 mg of versed.  Pt seizing upon arrival.  NRB.  Pt suctioned upon arrival with copious thick secretions.

## 2019-10-30 ENCOUNTER — Inpatient Hospital Stay (HOSPITAL_COMMUNITY): Payer: Medicaid Other

## 2019-10-30 DIAGNOSIS — A419 Sepsis, unspecified organism: Secondary | ICD-10-CM

## 2019-10-30 DIAGNOSIS — R9431 Abnormal electrocardiogram [ECG] [EKG]: Secondary | ICD-10-CM

## 2019-10-30 LAB — CBC
HCT: 44.4 % (ref 39.0–52.0)
Hemoglobin: 14.1 g/dL (ref 13.0–17.0)
MCH: 26.4 pg (ref 26.0–34.0)
MCHC: 31.8 g/dL (ref 30.0–36.0)
MCV: 83 fL (ref 80.0–100.0)
Platelets: 276 10*3/uL (ref 150–400)
RBC: 5.35 MIL/uL (ref 4.22–5.81)
RDW: 14.3 % (ref 11.5–15.5)
WBC: 12.6 10*3/uL — ABNORMAL HIGH (ref 4.0–10.5)
nRBC: 0 % (ref 0.0–0.2)

## 2019-10-30 LAB — ECHOCARDIOGRAM COMPLETE
AR max vel: 3.07 cm2
AV Area VTI: 2.74 cm2
AV Area mean vel: 2.82 cm2
AV Mean grad: 3 mmHg
AV Peak grad: 6.9 mmHg
Ao pk vel: 1.31 m/s
Height: 72 in
S' Lateral: 2.1 cm
Weight: 3425.07 oz

## 2019-10-30 LAB — TRIGLYCERIDES: Triglycerides: 158 mg/dL — ABNORMAL HIGH (ref <150)

## 2019-10-30 LAB — GLUCOSE, CAPILLARY
Glucose-Capillary: 109 mg/dL — ABNORMAL HIGH (ref 70–99)
Glucose-Capillary: 109 mg/dL — ABNORMAL HIGH (ref 70–99)
Glucose-Capillary: 116 mg/dL — ABNORMAL HIGH (ref 70–99)
Glucose-Capillary: 121 mg/dL — ABNORMAL HIGH (ref 70–99)
Glucose-Capillary: 127 mg/dL — ABNORMAL HIGH (ref 70–99)
Glucose-Capillary: 132 mg/dL — ABNORMAL HIGH (ref 70–99)
Glucose-Capillary: 144 mg/dL — ABNORMAL HIGH (ref 70–99)

## 2019-10-30 LAB — URINE CULTURE: Culture: NO GROWTH

## 2019-10-30 LAB — BASIC METABOLIC PANEL
Anion gap: 11 (ref 5–15)
BUN: 8 mg/dL (ref 6–20)
CO2: 20 mmol/L — ABNORMAL LOW (ref 22–32)
Calcium: 8.7 mg/dL — ABNORMAL LOW (ref 8.9–10.3)
Chloride: 110 mmol/L (ref 98–111)
Creatinine, Ser: 0.98 mg/dL (ref 0.61–1.24)
GFR calc Af Amer: >60 mL/min (ref >60)
GFR calc non Af Amer: >60 mL/min (ref >60)
Glucose, Bld: 133 mg/dL — ABNORMAL HIGH (ref 70–99)
Potassium: 3.3 mmol/L — ABNORMAL LOW (ref 3.5–5.1)
Sodium: 141 mmol/L (ref 135–145)

## 2019-10-30 LAB — HIV ANTIBODY (ROUTINE TESTING W REFLEX): HIV Screen 4th Generation wRfx: NONREACTIVE

## 2019-10-30 LAB — PROCALCITONIN: Procalcitonin: 1.79 ng/mL

## 2019-10-30 LAB — PHOSPHORUS: Phosphorus: 2 mg/dL — ABNORMAL LOW (ref 2.5–4.6)

## 2019-10-30 LAB — MAGNESIUM: Magnesium: 1.7 mg/dL (ref 1.7–2.4)

## 2019-10-30 MED ORDER — LEVETIRACETAM IN NACL 1500 MG/100ML IV SOLN
1500.0000 mg | Freq: Two times a day (BID) | INTRAVENOUS | Status: DC
  Administered 2019-10-30 – 2019-11-05 (×10): 1500 mg via INTRAVENOUS
  Filled 2019-10-30 (×12): qty 100

## 2019-10-30 MED ORDER — LEVETIRACETAM 500 MG PO TABS
1000.0000 mg | ORAL_TABLET | Freq: Two times a day (BID) | ORAL | Status: DC
  Filled 2019-10-30: qty 2

## 2019-10-30 MED ORDER — LORAZEPAM 2 MG/ML IJ SOLN
2.0000 mg | Freq: Once | INTRAMUSCULAR | Status: AC | PRN
  Administered 2019-10-30: 2 mg via INTRAVENOUS
  Filled 2019-10-30: qty 1

## 2019-10-30 MED ORDER — LEVETIRACETAM IN NACL 1000 MG/100ML IV SOLN
1000.0000 mg | Freq: Two times a day (BID) | INTRAVENOUS | Status: DC
  Administered 2019-10-30: 1000 mg via INTRAVENOUS
  Filled 2019-10-30: qty 100

## 2019-10-30 MED ORDER — PERFLUTREN LIPID MICROSPHERE
1.0000 mL | INTRAVENOUS | Status: AC | PRN
  Administered 2019-10-30: 4 mL via INTRAVENOUS
  Filled 2019-10-30: qty 10

## 2019-10-30 MED ORDER — LEVETIRACETAM IN NACL 1000 MG/100ML IV SOLN
1000.0000 mg | Freq: Once | INTRAVENOUS | Status: AC
  Administered 2019-10-30: 1000 mg via INTRAVENOUS
  Filled 2019-10-30: qty 100

## 2019-10-30 MED ORDER — POTASSIUM PHOSPHATES 15 MMOLE/5ML IV SOLN
20.0000 mmol | Freq: Once | INTRAVENOUS | Status: AC
  Administered 2019-10-30: 20 mmol via INTRAVENOUS
  Filled 2019-10-30: qty 6.67

## 2019-10-30 MED ORDER — LEVETIRACETAM 750 MG PO TABS
1500.0000 mg | ORAL_TABLET | Freq: Two times a day (BID) | ORAL | Status: DC
  Administered 2019-11-04: 1500 mg via ORAL
  Filled 2019-10-30 (×3): qty 2

## 2019-10-30 NOTE — Progress Notes (Signed)
°  Echocardiogram 2D Echocardiogram has been performed with Definity.  Gerda Diss 10/30/2019, 5:13 PM

## 2019-10-30 NOTE — Procedures (Signed)
Echo attempted. Nurse requested that we attempt again later.

## 2019-10-30 NOTE — Progress Notes (Signed)
While doing oral care patient was observed to have a R gaze with no blink to threat and what appeared to be rhythmic blinking. Neuro MD notified who was able to review EEG, not concerned for seizure activity.   Aris Lot, RN
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
## 2019-10-30 NOTE — Progress Notes (Signed)
Brief sz at 1305, gave Keppra 1G laoded and increased maintenance to 1500mg  BID. He also fevered with Tmax of 100.4. ICU team to obtain LP. I ordered PRN ativan 2mg  for seizure lasting more than 2 mins.

## 2019-10-30 NOTE — Progress Notes (Signed)
LTM maint complete - no skin breakdown under: Fp1 F7 A1. O1 received maintance

## 2019-10-30 NOTE — Progress Notes (Signed)
At 1306 patient HR increased into 160s, patient observed to have generalized shaking with a L upwards fixed gaze and no blink to threat. Patient HR back to 120s by 1308 with no further movement, but then with fixed R upwards gaze and no blink to threat. At 1312 patient eyes midline, no observable movement. No PRNs available, Propofol gtt increased and Neuro MD paged.  Aris Lot, RN

## 2019-10-30 NOTE — Care Plan (Signed)
Seizure at 1305 arising from right hemisphere, Dr Derry Lory notified, Please refer to full report for details.
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
## 2019-10-30 NOTE — Procedures (Addendum)
Lumbar Puncture Procedure Note  Matthew Glenn  [id number]  07-15-1985  Date:10/30/19  Time:6:17 PM   Provider Performing:Natallia Stellmach Marcos Eke   Procedure: Lumbar Puncture (46803)  Indication(s) Rule out meningitis  Consent Risks of the procedure as well as the alternatives and risks of each were explained to the patient and/or caregiver.  Consent for the procedure was obtained and is signed in the bedside chart  Anesthesia Topical only with 1% lidocaine    Time Out Verified patient identification, verified procedure, site/side was marked, verified correct patient position, special equipment/implants available, medications/allergies/relevant history reviewed, required imaging and test results available.   Sterile Technique Maximal sterile technique including sterile barrier drape, hand hygiene, sterile gown, sterile gloves, mask, hair covering.    Procedure Description Using palpation, approximate location of L4-L5 space identified.   Lidocaine used to anesthetize skin and subcutaneous tissue overlying this area.  A 20g spinal needle was then used.   Unable to access spinal fluid.   Attempted at L3-L4.  Some blood mixed with apparent csf noted.  Opening pressure:Not obtained. Aborted procedure.  Patient easily desaturated on turning and required increased FiO2 to 100%. .  Complications/Tolerance None; patient tolerated the procedure well.   EBL Minimal  Did not obtain CSF.

## 2019-10-31 ENCOUNTER — Inpatient Hospital Stay: Payer: Self-pay

## 2019-10-31 ENCOUNTER — Inpatient Hospital Stay (HOSPITAL_COMMUNITY): Payer: Medicaid Other

## 2019-10-31 DIAGNOSIS — R569 Unspecified convulsions: Secondary | ICD-10-CM

## 2019-10-31 LAB — CSF CELL COUNT WITH DIFFERENTIAL
Eosinophils, CSF: 0 % (ref 0–1)
Lymphs, CSF: 54 % (ref 40–80)
Monocyte-Macrophage-Spinal Fluid: 18 % (ref 15–45)
RBC Count, CSF: 2 /mm3 — ABNORMAL HIGH
Segmented Neutrophils-CSF: 28 % — ABNORMAL HIGH (ref 0–6)
Tube #: 3
WBC, CSF: 48 /mm3 (ref 0–5)

## 2019-10-31 LAB — CBC
HCT: 39.4 % (ref 39.0–52.0)
Hemoglobin: 12.8 g/dL — ABNORMAL LOW (ref 13.0–17.0)
MCH: 26.4 pg (ref 26.0–34.0)
MCHC: 32.5 g/dL (ref 30.0–36.0)
MCV: 81.2 fL (ref 80.0–100.0)
Platelets: 246 10*3/uL (ref 150–400)
RBC: 4.85 MIL/uL (ref 4.22–5.81)
RDW: 14.3 % (ref 11.5–15.5)
WBC: 12 10*3/uL — ABNORMAL HIGH (ref 4.0–10.5)
nRBC: 0 % (ref 0.0–0.2)

## 2019-10-31 LAB — BASIC METABOLIC PANEL
Anion gap: 11 (ref 5–15)
BUN: 8 mg/dL (ref 6–20)
CO2: 19 mmol/L — ABNORMAL LOW (ref 22–32)
Calcium: 8.3 mg/dL — ABNORMAL LOW (ref 8.9–10.3)
Chloride: 106 mmol/L (ref 98–111)
Creatinine, Ser: 0.84 mg/dL (ref 0.61–1.24)
GFR calc Af Amer: >60 mL/min (ref >60)
GFR calc non Af Amer: >60 mL/min (ref >60)
Glucose, Bld: 97 mg/dL (ref 70–99)
Potassium: 3.7 mmol/L (ref 3.5–5.1)
Sodium: 136 mmol/L (ref 135–145)

## 2019-10-31 LAB — CRYPTOCOCCAL ANTIGEN, CSF: Crypto Ag: NEGATIVE

## 2019-10-31 LAB — CULTURE, RESPIRATORY W GRAM STAIN: Culture: NORMAL

## 2019-10-31 LAB — GLUCOSE, CSF: Glucose, CSF: 58 mg/dL (ref 40–70)

## 2019-10-31 LAB — TRIGLYCERIDES: Triglycerides: 257 mg/dL — ABNORMAL HIGH (ref <150)

## 2019-10-31 LAB — GLUCOSE, CAPILLARY
Glucose-Capillary: 102 mg/dL — ABNORMAL HIGH (ref 70–99)
Glucose-Capillary: 100 mg/dL — ABNORMAL HIGH (ref 70–99)
Glucose-Capillary: 99 mg/dL (ref 70–99)
Glucose-Capillary: 89 mg/dL (ref 70–99)
Glucose-Capillary: 97 mg/dL (ref 70–99)
Glucose-Capillary: 96 mg/dL (ref 70–99)

## 2019-10-31 LAB — PROCALCITONIN: Procalcitonin: 1.25 ng/mL

## 2019-10-31 LAB — MAGNESIUM: Magnesium: 1.8 mg/dL (ref 1.7–2.4)

## 2019-10-31 LAB — PROTEIN, CSF: Total  Protein, CSF: 99 mg/dL — ABNORMAL HIGH (ref 15–45)

## 2019-10-31 LAB — VANCOMYCIN, TROUGH: Vancomycin Tr: 12 ug/mL — ABNORMAL LOW (ref 15–20)

## 2019-10-31 LAB — PHOSPHORUS: Phosphorus: 3.1 mg/dL (ref 2.5–4.6)

## 2019-10-31 MED ORDER — SODIUM CHLORIDE 0.9% FLUSH: 10.0000 mL | INTRAVENOUS | Status: DC | PRN

## 2019-10-31 MED ORDER — PANTOPRAZOLE SODIUM 40 MG PO PACK
40.0000 mg | PACK | Freq: Every day | ORAL | Status: DC
  Administered 2019-10-31 – 2019-11-05 (×6): 40 mg
  Filled 2019-10-31 (×6): qty 20

## 2019-10-31 MED ORDER — SODIUM CHLORIDE 0.9 % IV SOLN
2.0000 g | Freq: Three times a day (TID) | INTRAVENOUS | Status: DC
  Administered 2019-10-31 – 2019-11-01 (×3): 2 g via INTRAVENOUS
  Filled 2019-10-31 (×6): qty 2

## 2019-10-31 MED ORDER — VANCOMYCIN HCL 1250 MG/250ML IV SOLN: 1250.0000 mg | Freq: Two times a day (BID) | INTRAVENOUS | Status: DC

## 2019-10-31 MED ORDER — VITAL HIGH PROTEIN PO LIQD
1000.0000 mL | ORAL | Status: DC
  Administered 2019-10-31 – 2019-11-02 (×3): 1000 mL
  Filled 2019-10-31: qty 1000

## 2019-10-31 MED ORDER — VANCOMYCIN HCL 1250 MG/250ML IV SOLN
1250.0000 mg | Freq: Three times a day (TID) | INTRAVENOUS | Status: DC
  Administered 2019-10-31 – 2019-11-01 (×3): 1250 mg via INTRAVENOUS
  Filled 2019-10-31 (×4): qty 250

## 2019-10-31 MED ORDER — DEXTROSE 5 % IV SOLN
10.0000 mg/kg | Freq: Three times a day (TID) | INTRAVENOUS | Status: DC
  Administered 2019-10-31 – 2019-11-02 (×5): 870 mg via INTRAVENOUS
  Filled 2019-10-31 (×7): qty 17.4

## 2019-10-31 MED ORDER — LIDOCAINE HCL (PF) 1 % IJ SOLN
5.0000 mL | Freq: Once | INTRAMUSCULAR | Status: AC
  Administered 2019-10-31: 4 mL via INTRADERMAL

## 2019-10-31 MED ORDER — SODIUM CHLORIDE 0.9% FLUSH
10.0000 mL | Freq: Two times a day (BID) | INTRAVENOUS | Status: DC
  Administered 2019-10-31 – 2019-11-06 (×12): 10 mL
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
## 2019-10-31 NOTE — Progress Notes (Signed)
Pharmacy Antibiotic Note  Matthew Glenn is a 34 y.o. male admitted on 10/29/2019 with sepsis.  Concern for CNS infection  LP done earlier today  Height: 6' (182.9 cm) Weight: (!) 100.7 kg (222 lb 0.1 oz) IBW/kg (Calculated) : 77.6  Temp (24hrs), Avg:100.3 F (37.9 C), Min:99 F (37.2 C), Max:102.4 F (39.1 C)  Recent Labs  Lab 10/26/19 2316 10/29/19 0938 10/29/19 1605 10/30/19 0533 10/31/19 0344 10/31/19 1125  WBC 13.7* 21.2* 14.9* 12.6* 12.0*  --   CREATININE 0.92 1.11  --  0.98 0.84  --   LATICACIDVEN  --  1.8  --   --   --   --   VANCOTROUGH  --   --   --   --   --  12*    Estimated Creatinine Clearance: 155 mL/min (by C-G formula based on SCr of 0.84 mg/dL).    No Known Allergies  Plan: Continue previously ordered abx Add acyclovir 10 mg/kg adjBW q8h F/u planned ID consult  Elmer Sow, PharmD, BCPS, BCCCP Clinical Pharmacist [phone]  Please check AMION for all [facility] numbers  10/31/2019 7:15 PM

## 2019-10-31 NOTE — Care Plan (Signed)
Reviewed CSF cell count 48, neurophils 48, protein 99 with glucose 58. Contacted Dr Marcos Eke and recommended ID consult for further management of meningitis/encephalitis.    Abran Gavigan Annabelle Harman
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
## 2019-10-31 NOTE — Progress Notes (Signed)
Informed MD's Charlotte Sanes and Cheryll Dessert of critical lab value- CFS WBC 48. No new orders at this time.

## 2019-10-31 NOTE — Progress Notes (Signed)
Patient transported on vent to IR. Patient placed in prone position for a procedure and then placed back in supine position after the procedure was completed.  All position changes were done by  by RT x 1, RN x 2, and xray techs x 2.  Patient was then transferred back to 4N20 without complications.  Patient's ETT remained secured at 27cm at the lips for the entire time.

## 2019-10-31 NOTE — Procedures (Signed)
Fluoro guided LP performed at L2-L3.  Opening pressure 22 cm H2O.  13 mL clear CSF obtained.  No complications.
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
## 2019-10-31 NOTE — Social Work (Signed)
CSW acknowledging pt from [facility]. Will follow for pt progress and recommendations.   Octavio Graves, MSW, LCSW [facility] Clinical Social Work

## 2019-10-31 NOTE — Progress Notes (Signed)
Peripherally Inserted Central Catheter Placement  The IV Nurse has discussed with the patient and/or persons authorized to consent for the patient, the purpose of this procedure and the potential benefits and risks involved with this procedure.  The benefits include less needle sticks, lab draws from the catheter, and the patient may be discharged home with the catheter. Risks include, but not limited to, infection, bleeding, blood clot (thrombus formation), and puncture of an artery; nerve damage and irregular heartbeat and possibility to perform a PICC exchange if needed/ordered by physician.  Alternatives to this procedure were also discussed.  Bard Power PICC patient education guide, fact sheet on infection prevention and patient information card has been provided to patient /or left at bedside.  Phone consent obtain from mother Thurmond Hildebran.  PICC Placement Documentation  PICC Double Lumen 10/31/19 PICC Right Basilic 40 cm 0 cm (Active)  Indication for Insertion or Continuance of Line Prolonged intravenous therapies 10/31/19 2110  Exposed Catheter (cm) 0 cm 10/31/19 2110  Site Assessment Clean;Dry;Intact 10/31/19 2110  Lumen #1 Status Flushed;Saline locked;Blood return noted 10/31/19 2110  Lumen #2 Status Flushed;Saline locked;Blood return noted 10/31/19 2110  Dressing Type Transparent 10/31/19 2110  Dressing Status Clean;Dry;Intact;Antimicrobial disc in place 10/31/19 2110  Safety Lock Not Applicable 10/31/19 2110  Dressing Intervention New dressing 10/31/19 2110  Dressing Change Due 11/07/19 10/31/19 2110       Ethelda Chick 10/31/2019, 9:12 PM

## 2019-10-31 NOTE — Plan of Care (Signed)
  Problem: Nutrition: Goal: Adequate nutrition will be maintained Outcome: Progressing   Problem: Safety: Goal: Ability to remain free from injury will improve Outcome: Progressing   Problem: Activity: Goal: Risk for activity intolerance will decrease Outcome: Not Progressing   Problem: Activity: Goal: Ability to tolerate increased activity will improve Outcome: Not Progressing   Problem: Role Relationship: Goal: Method of communication will improve Outcome: Not Progressing

## 2019-10-31 NOTE — Progress Notes (Signed)
Pharmacy Antibiotic Note  Matthew Glenn is a 34 y.o. male admitted on 10/29/2019 with sepsis.  Patient is currently on IV Zosyn and vancomycin but with concern for meningitis, will switch Zosyn to meropenem for improved CNS penetration. Will continue to avoid cefepime in the setting of seizures. WBC 12. SCr wnl, PCT 1.25. LP was unable to be performed yesterday.   Of note, a 6.5-hr vancomycin trough obtained today was subtherapeutic at 12.     Plan: Stop Zosyn and start meropenem 2 gm IV Q 8 hours  Increase vancomycin to 1250 mg IV Q8 hr  Monitor cultures, clinical status, renal fx, vancomycin levels prn  Narrow abx as able and f/u duration    Height: 6' (182.9 cm) Weight: (!) 100.7 kg (222 lb 0.1 oz) IBW/kg (Calculated) : 77.6  Temp (24hrs), Avg:100.6 F (38.1 C), Min:99 F (37.2 C), Max:102.4 F (39.1 C)  Recent Labs  Lab 10/26/19 2316 10/29/19 0938 10/29/19 1605 10/30/19 0533 10/31/19 0344  WBC 13.7* 21.2* 14.9* 12.6* 12.0*  CREATININE 0.92 1.11  --  0.98 0.84  LATICACIDVEN  --  1.8  --   --   --     Estimated Creatinine Clearance: 155 mL/min (by C-G formula based on SCr of 0.84 mg/dL).    No Known Allergies  Antimicrobials this admission: piptazo 7/24 >> 7/26 Cefe  7/24 Vanc 7/24 >> MTZ 7/24 >> 7/24 Meropenem 7/26>>   7/26; Vanc trough 12    Microbiology results: 7/24 BCx: ngtd 7/24 TA >> normal flora  7/24 MRSA PCR: pos   Thank you for allowing pharmacy to be a part of this patient's care.  Vinnie Level, PharmD., BCPS, BCCCP Clinical Pharmacist Clinical phone for 10/31/19 until 3:30pm: [phone] If after 3:30pm, please refer to [facility] for unit-specific pharmacist
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
## 2019-11-01 ENCOUNTER — Inpatient Hospital Stay (HOSPITAL_COMMUNITY): Payer: Medicaid Other

## 2019-11-01 DIAGNOSIS — D72820 Lymphocytosis (symptomatic): Secondary | ICD-10-CM

## 2019-11-01 DIAGNOSIS — R509 Fever, unspecified: Secondary | ICD-10-CM

## 2019-11-01 DIAGNOSIS — Z9114 Patient's other noncompliance with medication regimen: Secondary | ICD-10-CM

## 2019-11-01 DIAGNOSIS — Z87820 Personal history of traumatic brain injury: Secondary | ICD-10-CM

## 2019-11-01 DIAGNOSIS — G030 Nonpyogenic meningitis: Secondary | ICD-10-CM

## 2019-11-01 DIAGNOSIS — J9601 Acute respiratory failure with hypoxia: Secondary | ICD-10-CM

## 2019-11-01 LAB — BASIC METABOLIC PANEL
Anion gap: 9 (ref 5–15)
BUN: 10 mg/dL (ref 6–20)
CO2: 24 mmol/L (ref 22–32)
Calcium: 8.6 mg/dL — ABNORMAL LOW (ref 8.9–10.3)
Chloride: 107 mmol/L (ref 98–111)
Creatinine, Ser: 0.88 mg/dL (ref 0.61–1.24)
GFR calc Af Amer: >60 mL/min (ref >60)
GFR calc non Af Amer: >60 mL/min (ref >60)
Glucose, Bld: 95 mg/dL (ref 70–99)
Potassium: 3.2 mmol/L — ABNORMAL LOW (ref 3.5–5.1)
Sodium: 140 mmol/L (ref 135–145)

## 2019-11-01 LAB — CBC
HCT: 39.5 % (ref 39.0–52.0)
Hemoglobin: 12.4 g/dL — ABNORMAL LOW (ref 13.0–17.0)
MCH: 25.9 pg — ABNORMAL LOW (ref 26.0–34.0)
MCHC: 31.4 g/dL (ref 30.0–36.0)
MCV: 82.5 fL (ref 80.0–100.0)
Platelets: 313 10*3/uL (ref 150–400)
RBC: 4.79 MIL/uL (ref 4.22–5.81)
RDW: 14.6 % (ref 11.5–15.5)
WBC: 9.6 10*3/uL (ref 4.0–10.5)
nRBC: 0 % (ref 0.0–0.2)

## 2019-11-01 LAB — GLUCOSE, CAPILLARY
Glucose-Capillary: 117 mg/dL — ABNORMAL HIGH (ref 70–99)
Glucose-Capillary: 94 mg/dL (ref 70–99)
Glucose-Capillary: 87 mg/dL (ref 70–99)
Glucose-Capillary: 103 mg/dL — ABNORMAL HIGH (ref 70–99)
Glucose-Capillary: 118 mg/dL — ABNORMAL HIGH (ref 70–99)
Glucose-Capillary: 110 mg/dL — ABNORMAL HIGH (ref 70–99)
Glucose-Capillary: 122 mg/dL — ABNORMAL HIGH (ref 70–99)

## 2019-11-01 LAB — TRIGLYCERIDES
Triglycerides: 2667 mg/dL — ABNORMAL HIGH (ref <150)
Triglycerides: 545 mg/dL — ABNORMAL HIGH (ref <150)

## 2019-11-01 LAB — VDRL, CSF: VDRL Quant, CSF: NONREACTIVE

## 2019-11-01 MED ORDER — IOHEXOL 300 MG/ML  SOLN
75.0000 mL | Freq: Once | INTRAMUSCULAR | Status: AC | PRN
  Administered 2019-11-01: 75 mL via INTRAVENOUS

## 2019-11-01 MED ORDER — SODIUM CHLORIDE 0.9 % IV SOLN
2.0000 g | Freq: Two times a day (BID) | INTRAVENOUS | Status: DC
  Administered 2019-11-01: 2 g via INTRAVENOUS
  Filled 2019-11-01: qty 20
  Filled 2019-11-01: qty 2

## 2019-11-01 MED ORDER — VALPROIC ACID 250 MG/5ML PO SOLN
500.0000 mg | Freq: Two times a day (BID) | ORAL | Status: DC
  Administered 2019-11-01 – 2019-11-05 (×10): 500 mg
  Filled 2019-11-01 (×11): qty 10

## 2019-11-01 MED ORDER — DEXMEDETOMIDINE HCL IN NACL 400 MCG/100ML IV SOLN
0.0000 ug/kg/h | INTRAVENOUS | Status: DC
  Administered 2019-11-01: 0.4 ug/kg/h via INTRAVENOUS
  Administered 2019-11-01 – 2019-11-02 (×2): 0.6 ug/kg/h via INTRAVENOUS
  Filled 2019-11-01 (×3): qty 100

## 2019-11-01 NOTE — Progress Notes (Signed)
eLink Physician-Brief Progress Note Patient Name: Matthew Glenn DOB: [birth year] MRN: [id number]   Date of Service  11/01/2019  HPI/Events of Note  Agitation - Patient reaching for ETT. Request for Bilateral soft wrist restraints.   eICU Interventions  Plan: 1. Bilateral soft wrist restraints X 4 hours.      Intervention Category Major Interventions: Delirium, psychosis, severe agitation - evaluation and management  Dusten Ellinwood Eugene 11/01/2019, 3:48 AM

## 2019-11-01 NOTE — Progress Notes (Signed)
Patient transported to CT and back to 4N20 with no complications. RT will continue to monitor.
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
## 2019-11-01 NOTE — Progress Notes (Signed)
EEG maint complete. No skin breakdown at electrode site FP1 FP2 F7 continue to monitor

## 2019-11-02 ENCOUNTER — Inpatient Hospital Stay (HOSPITAL_COMMUNITY): Payer: Medicaid Other

## 2019-11-02 DIAGNOSIS — G030 Nonpyogenic meningitis: Secondary | ICD-10-CM

## 2019-11-02 DIAGNOSIS — Z9289 Personal history of other medical treatment: Secondary | ICD-10-CM

## 2019-11-02 DIAGNOSIS — Z9114 Patient's other noncompliance with medication regimen: Secondary | ICD-10-CM

## 2019-11-02 DIAGNOSIS — R509 Fever, unspecified: Secondary | ICD-10-CM

## 2019-11-02 LAB — CBC
HCT: 35.8 % — ABNORMAL LOW (ref 39.0–52.0)
Hemoglobin: 11.4 g/dL — ABNORMAL LOW (ref 13.0–17.0)
MCH: 25.9 pg — ABNORMAL LOW (ref 26.0–34.0)
MCHC: 31.8 g/dL (ref 30.0–36.0)
MCV: 81.4 fL (ref 80.0–100.0)
Platelets: 304 10*3/uL (ref 150–400)
RBC: 4.4 MIL/uL (ref 4.22–5.81)
RDW: 14.7 % (ref 11.5–15.5)
WBC: 10.9 10*3/uL — ABNORMAL HIGH (ref 4.0–10.5)
nRBC: 0 % (ref 0.0–0.2)

## 2019-11-02 LAB — HSV DNA BY PCR (REFERENCE LAB)
HSV 1 DNA: NEGATIVE
HSV 2 DNA: NEGATIVE

## 2019-11-02 LAB — BASIC METABOLIC PANEL
Anion gap: 9 (ref 5–15)
BUN: 10 mg/dL (ref 6–20)
CO2: 21 mmol/L — ABNORMAL LOW (ref 22–32)
Calcium: 8.5 mg/dL — ABNORMAL LOW (ref 8.9–10.3)
Chloride: 106 mmol/L (ref 98–111)
Creatinine, Ser: 0.72 mg/dL (ref 0.61–1.24)
GFR calc Af Amer: >60 mL/min (ref >60)
GFR calc non Af Amer: >60 mL/min (ref >60)
Glucose, Bld: 130 mg/dL — ABNORMAL HIGH (ref 70–99)
Potassium: 3.2 mmol/L — ABNORMAL LOW (ref 3.5–5.1)
Sodium: 136 mmol/L (ref 135–145)

## 2019-11-02 LAB — PATHOLOGIST SMEAR REVIEW

## 2019-11-02 LAB — GLUCOSE, CAPILLARY
Glucose-Capillary: 128 mg/dL — ABNORMAL HIGH (ref 70–99)
Glucose-Capillary: 109 mg/dL — ABNORMAL HIGH (ref 70–99)
Glucose-Capillary: 118 mg/dL — ABNORMAL HIGH (ref 70–99)
Glucose-Capillary: 94 mg/dL (ref 70–99)
Glucose-Capillary: 97 mg/dL (ref 70–99)
Glucose-Capillary: 108 mg/dL — ABNORMAL HIGH (ref 70–99)

## 2019-11-02 LAB — PHOSPHORUS: Phosphorus: 3.2 mg/dL (ref 2.5–4.6)

## 2019-11-02 LAB — VALPROIC ACID LEVEL: Valproic Acid Lvl: <10 ug/mL — ABNORMAL LOW (ref 50.0–100.0)

## 2019-11-02 LAB — TRIGLYCERIDES: Triglycerides: 405 mg/dL — ABNORMAL HIGH (ref <150)

## 2019-11-02 LAB — MAGNESIUM: Magnesium: 1.9 mg/dL (ref 1.7–2.4)

## 2019-11-02 LAB — OLIGOCLONAL BANDS, CSF + SERM

## 2019-11-02 MED ORDER — OSMOLITE 1.5 CAL PO LIQD
1000.0000 mL | ORAL | Status: DC
  Administered 2019-11-02 – 2019-11-03 (×2): 1000 mL
  Filled 2019-11-02 (×5): qty 1000

## 2019-11-02 MED ORDER — PROSOURCE TF PO LIQD
45.0000 mL | Freq: Three times a day (TID) | ORAL | Status: DC
  Administered 2019-11-02 – 2019-11-04 (×6): 45 mL
  Filled 2019-11-02 (×8): qty 45

## 2019-11-02 MED ORDER — POTASSIUM CHLORIDE 20 MEQ/15ML (10%) PO SOLN
40.0000 meq | Freq: Once | ORAL | Status: AC
  Administered 2019-11-02: 40 meq
  Filled 2019-11-02: qty 30

## 2019-11-02 MED ORDER — METOPROLOL TARTRATE 5 MG/5ML IV SOLN
2.5000 mg | INTRAVENOUS | Status: DC | PRN
  Administered 2019-11-03: 2.5 mg via INTRAVENOUS
  Filled 2019-11-02: qty 5

## 2019-11-02 MED ORDER — MIDAZOLAM HCL 2 MG/2ML IJ SOLN
INTRAMUSCULAR | Status: AC
  Filled 2019-11-02: qty 2

## 2019-11-02 MED ORDER — MIDAZOLAM HCL 2 MG/2ML IJ SOLN
1.0000 mg | Freq: Once | INTRAMUSCULAR | Status: AC
  Administered 2019-11-02: 1 mg via INTRAVENOUS

## 2019-11-02 MED ORDER — AMLODIPINE BESYLATE 5 MG PO TABS
5.0000 mg | ORAL_TABLET | Freq: Every day | ORAL | Status: DC
  Administered 2019-11-02: 5 mg via ORAL
  Filled 2019-11-02: qty 1
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
## 2019-11-02 NOTE — Progress Notes (Signed)
[facility]  Date of Admission:  10/29/2019     Total days of antibiotics 4         ASSESSMENT:  Mr. Rebekah [place] CT scan is without evidence of abscess or infection and HSV CSF is negative. Recommend stopping antibiotics and discontinuing acyclovir as there is no clear evidence of infection. Neurology managing seizures and antiepileptic therapy. CCM attempting to begin weaning. Will continue to monitor off antibiotics. If continues to have fevers will consider additional work up/imaging.    PLAN:  1. Discontinue acyclovir 2. Monitor off antibiotics.  3. Seizure management per neurology 4. Ventilator management per CCM.   Active Problems:   Status epilepticus ([place])   Sepsis without acute organ dysfunction ([place])   Acute hypoxemic respiratory failure ([place])   . chlorhexidine gluconate (MEDLINE KIT)  15 mL Mouth Rinse BID  . Chlorhexidine Gluconate Cloth  6 each Topical Q0600  . docusate  100 mg Per Tube BID  . heparin  5,000 Units Subcutaneous Q8H  . insulin aspart  0-15 Units Subcutaneous Q4H  . levETIRAcetam  1,500 mg Oral Q12H  . mouth rinse  15 mL Mouth Rinse 10 times per day  . mupirocin ointment  1 application Nasal BID  . pantoprazole sodium  40 mg Per Tube QHS  . polyethylene glycol  17 g Per Tube Daily  . sodium chloride flush  10-40 mL Intracatheter Q12H  . valproic acid  500 mg Per Tube BID    SUBJECTIVE:  Elevated temperature around 100 with no significant fevers. CT negative for abscess or lesions. No recent seizures.  No Known Allergies   Review of Systems: Review of Systems  Constitutional: Negative for chills, fever and weight loss.  Respiratory: Negative for cough, shortness of breath and wheezing.   Cardiovascular: Negative for chest pain and leg swelling.  Gastrointestinal: Negative for abdominal pain, constipation, diarrhea, nausea and vomiting.  Skin: Negative for rash.      OBJECTIVE: Vitals:   11/02/19 0730  11/02/19 0800 11/02/19 0830 11/02/19 0900  BP: (!) 135/90 125/78 125/71 128/72  Pulse: 86 90 86 84  Resp: '22 22 22 21  ' Temp: 100 F (37.8 C) 99.9 F (37.7 C) 99.7 F (37.6 C)   TempSrc:  Bladder    SpO2: 96% 97% 99% 99%  Weight:      Height:       Body mass index is 30.08 kg/m.  Physical Exam Constitutional:      General: He is not in acute distress.    Appearance: He is well-developed.     Interventions: He is intubated.     Comments: Lying in bed with head of bed elevated; intubated.   Cardiovascular:     Rate and Rhythm: Normal rate and regular rhythm.     Heart sounds: Normal heart sounds.  Pulmonary:     Effort: Pulmonary effort is normal. He is intubated.     Breath sounds: Normal breath sounds.  Skin:    General: Skin is warm and dry.  Neurological:     Mental Status: He is alert and oriented to person, place, and time.     Lab Results Lab Results  Component Value Date   WBC 10.9 (H) 11/02/2019   HGB 11.4 (L) 11/02/2019   HCT 35.8 (L) 11/02/2019   MCV 81.4 11/02/2019   PLT 304 11/02/2019    Lab Results  Component Value Date   CREATININE 0.72 11/02/2019   BUN 10 11/02/2019  NA 136 11/02/2019   K 3.2 (L) 11/02/2019   CL 106 11/02/2019   CO2 21 (L) 11/02/2019    Lab Results  Component Value Date   ALT 38 10/29/2019   AST 39 10/29/2019   ALKPHOS 55 10/29/2019   BILITOT 0.8 10/29/2019     Microbiology: Recent Results (from the past 240 hour(s))  Culture, blood (routine x 2)     Status: None (Preliminary result)   Collection Time: 10/29/19  9:35 AM   Specimen: BLOOD RIGHT HAND  Result Value Ref Range Status   Specimen Description BLOOD RIGHT HAND  Final   Special Requests   Final    BOTTLES DRAWN AEROBIC AND ANAEROBIC Blood Culture adequate volume   Culture   Final    NO GROWTH 3 DAYS Performed at [facility], [address], [place], [place] [address]    Report Status PENDING  Incomplete  SARS Coronavirus 2 by RT PCR (hospital  order, performed in [facility] hospital lab) Nasopharyngeal Nasopharyngeal Swab     Status: None   Collection Time: 10/29/19  9:38 AM   Specimen: Nasopharyngeal Swab  Result Value Ref Range Status   SARS Coronavirus 2 NEGATIVE NEGATIVE Final    Comment: (NOTE) SARS-CoV-2 target nucleic acids are NOT DETECTED.  The SARS-CoV-2 RNA is generally detectable in upper and lower respiratory specimens during the acute phase of infection. The lowest concentration of SARS-CoV-2 viral copies this assay can detect is 250 copies / mL. A negative result does not preclude SARS-CoV-2 infection and should not be used as the sole basis for treatment or other patient management decisions.  A negative result may occur with improper specimen collection / handling, submission of specimen other than nasopharyngeal swab, presence of viral mutation(s) within the areas targeted by this assay, and inadequate number of viral copies (<250 copies / mL). A negative result must be combined with clinical observations, patient history, and epidemiological information.  Fact Sheet for Patients:   StrictlyIdeas.no  Fact Sheet for Healthcare Providers: BankingDealers.co.za  This test is not yet approved or  cleared by the [place] FDA and has been authorized for detection and/or diagnosis of SARS-CoV-2 by FDA under an Emergency Use Authorization (EUA).  This EUA will remain in effect (meaning this test can be used) for the duration of the COVID-19 declaration under Section 564(b)(1) of the Act, 21 U.S.C. section 360bbb-3(b)(1), unless the authorization is terminated or revoked sooner.  Performed at [facility], [place] [address] [place], [place] [address]   Urine culture     Status: None   Collection Time: 10/29/19 10:04 AM   Specimen: Urine, Random  Result Value Ref Range Status   Specimen Description URINE, RANDOM  Final   Special Requests NONE  Final    Culture   Final    NO GROWTH Performed at [facility], [place] [address] [place], [place] [address]    Report Status 10/30/2019 FINAL  Final  Culture, blood (routine x 2)     Status: None (Preliminary result)   Collection Time: 10/29/19 11:30 AM   Specimen: BLOOD RIGHT HAND  Result Value Ref Range Status   Specimen Description BLOOD RIGHT HAND  Final   Special Requests   Final    BOTTLES DRAWN AEROBIC AND ANAEROBIC Blood Culture adequate volume   Culture   Final    NO GROWTH 3 DAYS Performed at [facility], [place] [address], [place], [place] [address]  Report Status PENDING  Incomplete  MRSA PCR Screening     Status: Abnormal   Collection Time: 10/29/19  2:17 PM   Specimen: Nasopharyngeal  Result Value Ref Range Status   MRSA by PCR POSITIVE (A) NEGATIVE Final    Comment:        The GeneXpert MRSA Assay (FDA approved for NASAL specimens only), is one component of a comprehensive MRSA colonization surveillance program. It is not intended to diagnose MRSA infection nor to guide or monitor treatment for MRSA infections. RESULT CALLED TO, READ BACK BY AND VERIFIED WITH: RN Madilyn Fireman [id number] FCP Performed at [facility] [address] [place], [place] [address]   Culture, respiratory (non-expectorated)     Status: None   Collection Time: 10/29/19  3:54 PM   Specimen: Tracheal Aspirate; Respiratory  Result Value Ref Range Status   Specimen Description TRACHEAL ASPIRATE  Final   Special Requests NONE  Final   Gram Stain   Final    ABUNDANT WBC PRESENT, PREDOMINANTLY PMN FEW GRAM POSITIVE COCCI IN PAIRS IN CHAINS    Culture   Final    Consistent with normal respiratory flora. Performed at [facility], [place] [address] [place], [place] [address]    Report Status 10/31/2019 FINAL  Final  CSF culture     Status: None (Preliminary result)   Collection Time: 10/31/19  3:20 PM   Specimen: PATH Cytology CSF; Cerebrospinal Fluid  Result Value Ref  Range Status   Specimen Description CSF  Final   Special Requests NONE  Final   Gram Stain   Final    WBC PRESENT,BOTH PMN AND MONONUCLEAR NO ORGANISMS SEEN CYTOSPIN SMEAR    Culture   Final    NO GROWTH < 24 HOURS Performed at [facility], [place] [address], [place], [place] [address]    Report Status PENDING  Incomplete     Terri Piedra, NP [facility]  11/02/2019  9:21 AM
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
## 2019-11-02 NOTE — Progress Notes (Signed)
PCCM INTERVAL PROGRESS NOTE   Called due to hypertension. SBP > 180. Patient not agitated.   Plan Amend IV metoprolol order to be PRN for hypertension.  Add home amlodipine at half dose PO.     Joneen Roach, AGACNP-BC [facility]  See Amion for personal pager PCCM on call pager [phone]  11/02/2019 6:27 PM

## 2019-11-02 NOTE — Progress Notes (Signed)
LTM discontinued  - no skin breakdown under: Fp1  Fp2  F4 F3

## 2019-11-02 NOTE — Progress Notes (Signed)
Nutrition Follow-up  DOCUMENTATION CODES:   Not applicable  INTERVENTION:   Initiate tube feeding via Cortrak: Osmolite 1.5 at 65 ml/h (1560 ml per day) Prosource TF 45 ml TID  Provides 2460 kcal, 130 gm protein, 1188 ml free water daily  NUTRITION DIAGNOSIS:   Inadequate oral intake related to inability to eat as evidenced by NPO status. Ongoing.   GOAL:   Patient will meet greater than or equal to 90% of their needs Meeting with TF.   MONITOR:   TF tolerance, I & O's  REASON FOR ASSESSMENT:   Consult, Ventilator Enteral/tube feeding initiation and management  ASSESSMENT:   Pt with PMH of TBI from [facility] with hemiplegia, HTN, and seizures who was admitted with status epilepticus due to noncompliance.   Pt discussed during ICU rounds and with RN.  Pt extubated this am. Pt failed swallow, cortrak pending  7/28 extubated; Cortrak pending  Medications reviewed and include: colace, SSI, miralax  Labs reviewed    Diet Order:   Diet Order            Diet NPO time specified  Diet effective now                 EDUCATION NEEDS:   No education needs have been identified at this time  Skin:  Skin Assessment: Reviewed RN Assessment  Last BM:  7/28  Height:   Ht Readings from Last 1 Encounters:  10/29/19 6' (1.829 m)    Weight:   Wt Readings from Last 1 Encounters:  11/02/19 (!) 100.6 kg    Ideal Body Weight:     BMI:  Body mass index is 30.08 kg/m.  Estimated Nutritional Needs:   Kcal:  2400-2600  Protein:  120-135 grams  Fluid:  >2 L/day  Cammy Copa., RD, LDN, CNSC See AMiON for contact information
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
## 2019-11-02 NOTE — Procedures (Signed)
Extubation Procedure Note  Patient Details:   Name: Matthew Glenn DOB: 08-Mar-1986 MRN: [id number]   Airway Documentation:    Vent end date: 11/02/19 Vent end time: 1015   Evaluation  O2 sats: stable throughout Complications: No apparent complications Patient did tolerate procedure well. Bilateral Breath Sounds: Clear, Diminished   Yes   Patient extubated per order to 4L [place] with no apparent complications. Positive cuff leak was noted prior to extubation and CCM was in room during extubation. Patient is alert and able to weakly speak his name. Vitals are stable. RT will continue to monitor.   [place] Pelto Lajuana [place] 11/02/2019, 10:31 AM

## 2019-11-02 NOTE — Procedures (Signed)
Cortrak  Person Inserting Tube:  [name], RD Tube Type:  Cortrak - 43 inches Tube Location:  Right nare Initial Placement:  Stomach Secured by: Bridle Technique Used to Measure Tube Placement:  Documented cm marking at nare/ corner of mouth Cortrak Secured At:  67 cm Procedure Comments:  Cortrak Tube Team Note:  Consult received to place a Cortrak feeding tube.   No x-ray is required. RN may begin using tube.   If the tube becomes dislodged please keep the tube and contact the Cortrak team at [email or web address] ([email or web address]) for replacement.  If after hours and replacement cannot be delayed, place a NG tube and confirm placement with an abdominal x-ray.     Matthew Conard MS, RDN, LDN, CNSC Registered Dietitian III Clinical Nutrition RD Pager and On-Call Pager Number Located in Amion      

## 2019-11-03 DIAGNOSIS — Z0189 Encounter for other specified special examinations: Secondary | ICD-10-CM

## 2019-11-03 DIAGNOSIS — L8990 Pressure ulcer of unspecified site, unspecified stage: Secondary | ICD-10-CM | POA: Insufficient documentation

## 2019-11-03 LAB — MAGNESIUM: Magnesium: 2 mg/dL (ref 1.7–2.4)

## 2019-11-03 LAB — PHOSPHORUS: Phosphorus: 3.5 mg/dL (ref 2.5–4.6)

## 2019-11-03 LAB — CULTURE, BLOOD (ROUTINE X 2)
Culture: NO GROWTH
Culture: NO GROWTH
Special Requests: ADEQUATE
Special Requests: ADEQUATE

## 2019-11-03 LAB — CSF CULTURE W GRAM STAIN: Culture: NO GROWTH

## 2019-11-03 LAB — CBC
HCT: 39.3 % (ref 39.0–52.0)
Hemoglobin: 12.3 g/dL — ABNORMAL LOW (ref 13.0–17.0)
MCH: 26 pg (ref 26.0–34.0)
MCHC: 31.3 g/dL (ref 30.0–36.0)
MCV: 83.1 fL (ref 80.0–100.0)
Platelets: 356 10*3/uL (ref 150–400)
RBC: 4.73 MIL/uL (ref 4.22–5.81)
RDW: 15 % (ref 11.5–15.5)
WBC: 10 10*3/uL (ref 4.0–10.5)
nRBC: 0 % (ref 0.0–0.2)

## 2019-11-03 LAB — BASIC METABOLIC PANEL
Anion gap: 9 (ref 5–15)
BUN: 8 mg/dL (ref 6–20)
CO2: 24 mmol/L (ref 22–32)
Calcium: 9.2 mg/dL (ref 8.9–10.3)
Chloride: 107 mmol/L (ref 98–111)
Creatinine, Ser: 0.71 mg/dL (ref 0.61–1.24)
GFR calc Af Amer: >60 mL/min (ref >60)
GFR calc non Af Amer: >60 mL/min (ref >60)
Glucose, Bld: 122 mg/dL — ABNORMAL HIGH (ref 70–99)
Potassium: 3.7 mmol/L (ref 3.5–5.1)
Sodium: 140 mmol/L (ref 135–145)

## 2019-11-03 LAB — GLUCOSE, CAPILLARY
Glucose-Capillary: 106 mg/dL — ABNORMAL HIGH (ref 70–99)
Glucose-Capillary: 110 mg/dL — ABNORMAL HIGH (ref 70–99)
Glucose-Capillary: 114 mg/dL — ABNORMAL HIGH (ref 70–99)
Glucose-Capillary: 121 mg/dL — ABNORMAL HIGH (ref 70–99)
Glucose-Capillary: 126 mg/dL — ABNORMAL HIGH (ref 70–99)
Glucose-Capillary: 136 mg/dL — ABNORMAL HIGH (ref 70–99)

## 2019-11-03 LAB — VALPROIC ACID LEVEL: Valproic Acid Lvl: <10 ug/mL — ABNORMAL LOW (ref 50.0–100.0)

## 2019-11-03 MED ORDER — HYDRALAZINE HCL 10 MG PO TABS
10.0000 mg | ORAL_TABLET | Freq: Three times a day (TID) | ORAL | Status: DC
  Administered 2019-11-03 – 2019-11-06 (×9): 10 mg
  Filled 2019-11-03 (×10): qty 1

## 2019-11-03 MED ORDER — HYDRALAZINE HCL 10 MG PO TABS
10.0000 mg | ORAL_TABLET | Freq: Three times a day (TID) | ORAL | Status: DC
  Filled 2019-11-03 (×2): qty 1

## 2019-11-03 MED ORDER — AMLODIPINE BESYLATE 10 MG PO TABS
10.0000 mg | ORAL_TABLET | Freq: Every day | ORAL | Status: DC
  Administered 2019-11-03 – 2019-11-05 (×3): 10 mg
  Filled 2019-11-03 (×3): qty 1

## 2019-11-03 MED ORDER — AMLODIPINE BESYLATE 10 MG PO TABS
10.0000 mg | ORAL_TABLET | Freq: Every day | ORAL | Status: DC
  Filled 2019-11-03: qty 1

## 2019-11-03 NOTE — Progress Notes (Signed)
Informed pt's mother of pt's new room number ans transfer
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
## 2019-11-03 NOTE — Progress Notes (Signed)
eLink Physician-Brief Progress Note Patient Name: Matthew Glenn DOB: 1985/11/01 MRN: [id number]   Date of Service  11/03/2019  HPI/Events of Note  Bed side RN asking for whether he can be shifted to floor.  Notes reviewed.  Camera: Stable VS. NG tube.  Breakthrough seizure Epilepsy TBI Chronic strokes Encephalopathy,infectious Right-sided hemiparesis, chronic - Breakthrough seizure in setting of non compliance with AEDs, infection -LP showed 48 cells with 28% neutrophils, 99 protein, normal glucose, negative HSV, negative cryptococcal.  Per ID, low suspicion for meningitis, potentially could be secondary to seizures/status epilepticus.   eICU Interventions  - will await for Neurology to make that decision, stable to go to floor. Can be done early  in AM.       Intervention Category Intermediate Interventions: Other: Minor Interventions: Communication with other healthcare providers and/or family  Ranee Gosselin 11/03/2019, 4:01 AM

## 2019-11-03 NOTE — Progress Notes (Signed)
Attempted to call pt's mother to inform of transfer. Left voice mail requesting to call this RNs number back to update.
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
## 2019-11-03 NOTE — Progress Notes (Signed)
[facility] for Infectious Disease  Date of Admission:  10/29/2019     Total days of antibiotics 4         ASSESSMENT:  Matthew Glenn had a max temperature of 100.4 yesterday and has been afebrile since. No seizures. There remains no clear evidence of infection. Recommend no further antibiotic treatment at this time. ID will sign and be available as needed.   PLAN:  1. Continue to monitor off antibiotics.  ID will sign off. Please reconsult as needed.    Active Problems:   Seizure ([facility])   Status epilepticus ([facility])   Sepsis without acute organ dysfunction ([facility])   Acute hypoxemic respiratory failure ([facility])   History of ETT   Non compliance w medication regimen   FUO (fever of unknown origin)   Aseptic meningitis   Pressure injury of skin   . amLODipine  10 mg Per Tube Daily  . chlorhexidine gluconate (MEDLINE KIT)  15 mL Mouth Rinse BID  . Chlorhexidine Gluconate Cloth  6 each Topical Q0600  . docusate  100 mg Per Tube BID  . feeding supplement (PROSource TF)  45 mL Per Tube TID  . heparin  5,000 Units Subcutaneous Q8H  . hydrALAZINE  10 mg Per Tube Q8H  . insulin aspart  0-15 Units Subcutaneous Q4H  . levETIRAcetam  1,500 mg Oral Q12H  . mouth rinse  15 mL Mouth Rinse 10 times per day  . pantoprazole sodium  40 mg Per Tube QHS  . polyethylene glycol  17 g Per Tube Daily  . sodium chloride flush  10-40 mL Intracatheter Q12H  . valproic acid  500 mg Per Tube BID    SUBJECTIVE:  Mildly elevated temp of 100.4 in the last 24 hours otherwise afebrile. Doing okay today.   No Known Allergies   Review of Systems: Review of Systems  Constitutional: Negative for chills, fever and weight loss.  Respiratory: Negative for cough, shortness of breath and wheezing.   Cardiovascular: Negative for chest pain and leg swelling.  Gastrointestinal: Negative for abdominal pain, constipation, diarrhea, nausea and vomiting.  Skin: Negative for rash.      OBJECTIVE: Vitals:    11/03/19 0600 11/03/19 0800 11/03/19 0943 11/03/19 1000  BP: (!) 163/83  (!) 164/102 (!) 161/134  Pulse: 95   99  Resp: (!) 27   23  Temp:  98.4 F (36.9 C)    TempSrc:  Oral    SpO2: 100%   100%  Weight:      Height:       Body mass index is 30.08 kg/m.  Physical Exam Constitutional:      General: Matthew Glenn is not in acute distress.    Appearance: Matthew Glenn is well-developed.     Comments: Lying in bed with head of bed elevated.   Cardiovascular:     Rate and Rhythm: Normal rate and regular rhythm.     Heart sounds: Normal heart sounds.  Pulmonary:     Effort: Pulmonary effort is normal.     Breath sounds: Normal breath sounds.  Skin:    General: Skin is warm and dry.  Neurological:     Mental Status: Matthew Glenn is alert.     Lab Results Lab Results  Component Value Date   WBC 10.0 11/03/2019   HGB 12.3 (L) 11/03/2019   HCT 39.3 11/03/2019   MCV 83.1 11/03/2019   PLT 356 11/03/2019    Lab Results  Component Value Date   CREATININE 0.71  11/03/2019   BUN 8 11/03/2019   NA 140 11/03/2019   K 3.7 11/03/2019   CL 107 11/03/2019   CO2 24 11/03/2019    Lab Results  Component Value Date   ALT 38 10/29/2019   AST 39 10/29/2019   ALKPHOS 55 10/29/2019   BILITOT 0.8 10/29/2019     Microbiology: Recent Results (from the past 240 hour(s))  Culture, blood (routine x 2)     Status: None (Preliminary result)   Collection Time: 10/29/19  9:35 AM   Specimen: BLOOD RIGHT HAND  Result Value Ref Range Status   Specimen Description BLOOD RIGHT HAND  Final   Special Requests   Final    BOTTLES DRAWN AEROBIC AND ANAEROBIC Blood Culture adequate volume   Culture   Final    NO GROWTH 3 DAYS Performed at [facility], [address] [place], [place] [address]    Report Status PENDING  Incomplete  SARS Coronavirus 2 by RT PCR (hospital order, performed in [facility] hospital lab) Nasopharyngeal Nasopharyngeal Swab     Status: None   Collection Time: 10/29/19  9:38 AM   Specimen:  Nasopharyngeal Swab  Result Value Ref Range Status   SARS Coronavirus 2 NEGATIVE NEGATIVE Final    Comment: (NOTE) SARS-CoV-2 target nucleic acids are NOT DETECTED.  The SARS-CoV-2 RNA is generally detectable in upper and lower respiratory specimens during the acute phase of infection. The lowest concentration of SARS-CoV-2 viral copies this assay can detect is 250 copies / mL. A negative result does not preclude SARS-CoV-2 infection and should not be used as the sole basis for treatment or other patient management decisions.  A negative result may occur with improper specimen collection / handling, submission of specimen other than nasopharyngeal swab, presence of viral mutation(s) within the areas targeted by this assay, and inadequate number of viral copies (<250 copies / mL). A negative result must be combined with clinical observations, patient history, and epidemiological information.  Fact Sheet for Patients:   StrictlyIdeas.no  Fact Sheet for Healthcare Providers: BankingDealers.co.za  This test is not yet approved or  cleared by the [place] FDA and has been authorized for detection and/or diagnosis of SARS-CoV-2 by FDA under an Emergency Use Authorization (EUA).  This EUA will remain in effect (meaning this test can be used) for the duration of the COVID-19 declaration under Section 564(b)(1) of the Act, 21 U.S.C. section 360bbb-3(b)(1), unless the authorization is terminated or revoked sooner.  Performed at [facility], [place] [address] [place], [place] [address]   Urine culture     Status: None   Collection Time: 10/29/19 10:04 AM   Specimen: Urine, Random  Result Value Ref Range Status   Specimen Description URINE, RANDOM  Final   Special Requests NONE  Final   Culture   Final    NO GROWTH Performed at [facility], [place] [address], [place], [place] [address]    Report Status 10/30/2019 FINAL   Final  Culture, blood (routine x 2)     Status: None (Preliminary result)   Collection Time: 10/29/19 11:30 AM   Specimen: BLOOD RIGHT HAND  Result Value Ref Range Status   Specimen Description BLOOD RIGHT HAND  Final   Special Requests   Final    BOTTLES DRAWN AEROBIC AND ANAEROBIC Blood Culture adequate volume   Culture   Final    NO GROWTH 3 DAYS Performed at [facility], [place]  [address], [place], [place] [address]    Report Status PENDING  Incomplete  MRSA PCR Screening     Status: Abnormal   Collection Time: 10/29/19  2:17 PM   Specimen: Nasopharyngeal  Result Value Ref Range Status   MRSA by PCR POSITIVE (A) NEGATIVE Final    Comment:        The GeneXpert MRSA Assay (FDA approved for NASAL specimens only), is one component of a comprehensive MRSA colonization surveillance program. It is not intended to diagnose MRSA infection nor to guide or monitor treatment for MRSA infections. RESULT CALLED TO, READ BACK BY AND VERIFIED WITH: RN Madilyn Fireman [id number] FCP Performed at [facility] [address] [place], [place] [address]   Culture, respiratory (non-expectorated)     Status: None   Collection Time: 10/29/19  3:54 PM   Specimen: Tracheal Aspirate; Respiratory  Result Value Ref Range Status   Specimen Description TRACHEAL ASPIRATE  Final   Special Requests NONE  Final   Gram Stain   Final    ABUNDANT WBC PRESENT, PREDOMINANTLY PMN FEW GRAM POSITIVE COCCI IN PAIRS IN CHAINS    Culture   Final    Consistent with normal respiratory flora. Performed at [facility], [place] [address], [place], [place] [address]    Report Status 10/31/2019 FINAL  Final  CSF culture     Status: None (Preliminary result)   Collection Time: 10/31/19  3:20 PM   Specimen: PATH Cytology CSF; Cerebrospinal Fluid  Result Value Ref Range Status   Specimen Description CSF  Final   Special Requests NONE  Final   Gram Stain   Final    WBC PRESENT,BOTH PMN AND MONONUCLEAR NO  ORGANISMS SEEN CYTOSPIN SMEAR    Culture   Final    NO GROWTH 2 DAYS Performed at [facility], [address], [place], [place] [address]    Report Status PENDING  Incomplete     Terri Piedra, NP [facility]  11/03/2019  10:33 AM
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
## 2019-11-03 NOTE — Progress Notes (Signed)
Secure chat sent to Dr. Merrily Pew, okay'd to leave PICC and will order to d/c PICC line removal order.
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
## 2019-11-04 ENCOUNTER — Inpatient Hospital Stay (HOSPITAL_COMMUNITY): Payer: Medicaid Other

## 2019-11-04 LAB — GLUCOSE, CAPILLARY
Glucose-Capillary: 102 mg/dL — ABNORMAL HIGH (ref 70–99)
Glucose-Capillary: 123 mg/dL — ABNORMAL HIGH (ref 70–99)
Glucose-Capillary: 106 mg/dL — ABNORMAL HIGH (ref 70–99)
Glucose-Capillary: 110 mg/dL — ABNORMAL HIGH (ref 70–99)
Glucose-Capillary: 122 mg/dL — ABNORMAL HIGH (ref 70–99)
Glucose-Capillary: 95 mg/dL (ref 70–99)

## 2019-11-04 MED ORDER — ENSURE ENLIVE PO LIQD
237.0000 mL | Freq: Two times a day (BID) | ORAL | Status: DC
  Administered 2019-11-04 – 2019-11-05 (×2): 237 mL via ORAL

## 2019-11-04 MED ORDER — ADULT MULTIVITAMIN W/MINERALS CH
1.0000 | ORAL_TABLET | Freq: Every day | ORAL | Status: DC
  Administered 2019-11-04 – 2019-11-07 (×5): 1 via ORAL
  Filled 2019-11-04 (×5): qty 1

## 2019-11-04 MED ORDER — OSMOLITE 1.5 CAL PO LIQD
780.0000 mL | ORAL | Status: DC
  Administered 2019-11-04: 780 mL
  Filled 2019-11-04 (×2): qty 948
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
## 2019-11-04 NOTE — Progress Notes (Signed)
Modified Barium Swallow Progress Note  Patient Details  Name: Matthew Glenn MRN: [id number] Date of Birth: 02-Sep-1985  Today's Date: 11/04/2019  Modified Barium Swallow completed.  Full report located under Chart Review in the Imaging Section.  Brief recommendations include the following:  Clinical Impression  Patient presents with a mild-moderate oral dysphagia characterized by moderate right sided facial weakness (baseline) resulting in moderate anterior labial spillage of bolus, prolonged mastication with decreased bolus cohesion and inconsistent premature spillage of bolus. Despite above however, patient was able to  masticate and orally transit all boluses provided (chewed pill despite instruction to swallow whole), and protect airway across trials without evidence of penetration or aspiration. Recommend initiation of a dysphagia 2 diet, thin liquid with SLP f/u at bedside for further diet upgrade pending performance with meal tray.    Swallow Evaluation Recommendations       SLP Diet Recommendations: Dysphagia 2 (Fine chop) solids;Thin liquid   Liquid Administration via: Straw   Medication Administration: Crushed with puree   Supervision: Staff to assist with self feeding;Full supervision/cueing for compensatory strategies   Compensations: Slow rate;Small sips/bites;Lingual sweep for clearance of pocketing       Oral Care Recommendations: Oral care BID       Mali Eppard MA, CCC-SLP   [name] 11/04/2019,12:20 PM

## 2019-11-04 NOTE — Progress Notes (Signed)
Nutrition Follow-up  DOCUMENTATION CODES:   Not applicable  INTERVENTION:   -Initiate 48 hour calorie count per MD -D/c Prostat -Magic cup TID with meals, each supplement provides 290 kcal and 9 grams of protein -Ensure Enlive po BID, each supplement provides 350 kcal and 20 grams of protein -Transition to nocturnal feedings:  Initiate Osmolite 1.5 @ 65 ml/hr via cortrak tube over 12 hours   Tube feeding regimen provides 1170 kcal (49% of needs), 49 grams of protein, and 598 ml of H2O.   NUTRITION DIAGNOSIS:   Inadequate oral intake related to inability to eat as evidenced by NPO status.  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  7/28- extubated, s/p cortrak placement- tip of tube confirmed in stomach 7/30- s/p MBSS- advanced to dysphagia 2 diet with thin liquids  Reviewed I/O's: -5.4 ml x 24 hours and +2.2 L since admission  UOP: 600 ml x 24 hours  Attempted to speak with pt via hospital room phone, however, no answer.   Pt diet just advanced to dysphagia 2 diet with thin liquids. Per SLP note, good potential for diet upgrade.   Per MD, considering removing feeding tube. RD will initiate calorie count and initiate nocturnal feedings.   Plan to d/c back to SNF [facility]) once medically stable.  Labs reviewed: CBGS: 102-123 (inpatient orders for glycemic control are 0-15 units insulin aspart every 4 hours).   Diet Order:   Diet Order            DIET DYS 2 Room service appropriate? Yes; Fluid consistency: Thin  Diet effective now                 EDUCATION NEEDS:   No education needs have been identified at this  time  Skin:  Skin Assessment: Skin Integrity Issues: Skin Integrity Issues:: Stage I Stage I: sacrum  Last BM:  11/03/19  Height:   Ht Readings from Last 1 Encounters:  10/29/19 6' (1.829 m)    Weight:   Wt Readings from Last 1 Encounters:  11/04/19 (!) 104 kg   BMI:  Body mass index is 31.1 kg/m.  Estimated Nutritional Needs:   Kcal:  2400-2600  Protein:  120-135 grams  Fluid:  >2 L/day    Levada Schilling, RD, LDN, CDCES Registered Dietitian II Certified Diabetes Care and Education Specialist Please refer to [facility] for RD and/or RD on-call/weekend/after hours pager
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
## 2019-11-05 LAB — GLUCOSE, CAPILLARY
Glucose-Capillary: 126 mg/dL — ABNORMAL HIGH (ref 70–99)
Glucose-Capillary: 116 mg/dL — ABNORMAL HIGH (ref 70–99)
Glucose-Capillary: 126 mg/dL — ABNORMAL HIGH (ref 70–99)
Glucose-Capillary: 103 mg/dL — ABNORMAL HIGH (ref 70–99)
Glucose-Capillary: 126 mg/dL — ABNORMAL HIGH (ref 70–99)

## 2019-11-05 LAB — BASIC METABOLIC PANEL
Anion gap: 8 (ref 5–15)
BUN: 12 mg/dL (ref 6–20)
CO2: 27 mmol/L (ref 22–32)
Calcium: 9.2 mg/dL (ref 8.9–10.3)
Chloride: 103 mmol/L (ref 98–111)
Creatinine, Ser: 0.76 mg/dL (ref 0.61–1.24)
GFR calc Af Amer: >60 mL/min (ref >60)
GFR calc non Af Amer: >60 mL/min (ref >60)
Glucose, Bld: 125 mg/dL — ABNORMAL HIGH (ref 70–99)
Potassium: 4 mmol/L (ref 3.5–5.1)
Sodium: 138 mmol/L (ref 135–145)

## 2019-11-05 MED ORDER — LEVETIRACETAM 100 MG/ML PO SOLN
1500.0000 mg | Freq: Two times a day (BID) | ORAL | Status: DC
  Administered 2019-11-05 (×2): 1500 mg
  Filled 2019-11-05 (×3): qty 15
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
## 2019-11-05 NOTE — Progress Notes (Signed)
Coretrak tube discontinued by order by Dr. Renford Dills, MD. Patient tolerated well and is increasing oral intake.

## 2019-11-06 LAB — SARS CORONAVIRUS 2 BY RT PCR (HOSPITAL ORDER, PERFORMED IN ~~LOC~~ HOSPITAL LAB): SARS Coronavirus 2: NEGATIVE

## 2019-11-06 LAB — GLUCOSE, CAPILLARY
Glucose-Capillary: 101 mg/dL — ABNORMAL HIGH (ref 70–99)
Glucose-Capillary: 102 mg/dL — ABNORMAL HIGH (ref 70–99)
Glucose-Capillary: 103 mg/dL — ABNORMAL HIGH (ref 70–99)
Glucose-Capillary: 104 mg/dL — ABNORMAL HIGH (ref 70–99)
Glucose-Capillary: 120 mg/dL — ABNORMAL HIGH (ref 70–99)
Glucose-Capillary: 97 mg/dL (ref 70–99)

## 2019-11-06 MED ORDER — ACETAMINOPHEN 325 MG PO TABS: 650.0000 mg | ORAL_TABLET | ORAL | Status: DC | PRN

## 2019-11-06 MED ORDER — PANTOPRAZOLE SODIUM 40 MG PO TBEC
40.0000 mg | DELAYED_RELEASE_TABLET | Freq: Every day | ORAL | Status: DC
  Administered 2019-11-06: 40 mg via ORAL
  Filled 2019-11-06: qty 1

## 2019-11-06 MED ORDER — ENSURE ENLIVE PO LIQD: 237.0000 mL | Freq: Two times a day (BID) | ORAL | 12 refills | Status: AC

## 2019-11-06 MED ORDER — DIVALPROEX SODIUM 125 MG PO CSDR
500.0000 mg | DELAYED_RELEASE_CAPSULE | Freq: Two times a day (BID) | ORAL | Status: DC
  Administered 2019-11-06 – 2019-11-07 (×3): 500 mg via ORAL
  Filled 2019-11-06 (×3): qty 4

## 2019-11-06 MED ORDER — HYDRALAZINE HCL 10 MG PO TABS
10.0000 mg | ORAL_TABLET | Freq: Three times a day (TID) | ORAL | Status: DC
  Administered 2019-11-06 – 2019-11-07 (×3): 10 mg via ORAL
  Filled 2019-11-06 (×3): qty 1

## 2019-11-06 MED ORDER — PANTOPRAZOLE SODIUM 40 MG PO TBEC: 40.0000 mg | DELAYED_RELEASE_TABLET | Freq: Every day | ORAL | Status: AC

## 2019-11-06 MED ORDER — DOCUSATE SODIUM 100 MG PO CAPS
100.0000 mg | ORAL_CAPSULE | Freq: Two times a day (BID) | ORAL | Status: DC
  Administered 2019-11-06 – 2019-11-07 (×2): 100 mg via ORAL
  Filled 2019-11-06 (×2): qty 1

## 2019-11-06 MED ORDER — DIVALPROEX SODIUM 125 MG PO CSDR: 500.0000 mg | DELAYED_RELEASE_CAPSULE | Freq: Two times a day (BID) | ORAL | Status: AC

## 2019-11-06 MED ORDER — AMLODIPINE BESYLATE 10 MG PO TABS
10.0000 mg | ORAL_TABLET | Freq: Every day | ORAL | Status: DC
  Administered 2019-11-06 – 2019-11-07 (×2): 10 mg via ORAL
  Filled 2019-11-06 (×2): qty 1

## 2019-11-06 MED ORDER — LEVETIRACETAM 750 MG PO TABS
1500.0000 mg | ORAL_TABLET | Freq: Two times a day (BID) | ORAL | Status: DC
  Administered 2019-11-06 – 2019-11-07 (×3): 1500 mg via ORAL
  Filled 2019-11-06 (×3): qty 2

## 2019-11-06 MED ORDER — LEVETIRACETAM 750 MG PO TABS: 1500.0000 mg | ORAL_TABLET | Freq: Two times a day (BID) | ORAL | Status: AC

## 2019-11-06 MED ORDER — IBUPROFEN 200 MG PO TABS: 400.0000 mg | ORAL_TABLET | Freq: Four times a day (QID) | ORAL | Status: DC | PRN

## 2019-11-06 MED ORDER — HYDRALAZINE HCL 10 MG PO TABS: 10.0000 mg | ORAL_TABLET | Freq: Three times a day (TID) | ORAL | Status: AC

## 2019-11-06 MED ORDER — POLYETHYLENE GLYCOL 3350 17 G PO PACK
17.0000 g | PACK | Freq: Every day | ORAL | Status: DC
  Administered 2019-11-06 – 2019-11-07 (×3): 17 g via ORAL
  Filled 2019-11-06 (×3): qty 1

## 2019-11-06 NOTE — TOC Progression Note (Addendum)
Transition of Care [facility]) - Progression Note    Patient Details  Name: Matthew Glenn MRN: [id number] Date of Birth: 11/27/1985  Transition of [facility] CM/SW Contact  Nonda Lou, [place] Phone Number: 11/06/2019, 8:27 AM  Clinical Narrative:    1:30p CSW spoke with [facility]. Informed patient can discharge to facility on Monday. CSW updated RN.  1:00p CSW received telephone call from [facility] CSW inquired on patient returning to the facility today, Velna Hatchet will follow-up with CSW.  8:25a CSW attempted to make contact with [facility] to inquire on any discharge needs. CSW awaiting a call back.   Expected Discharge Plan: Skilled Nursing Facility Barriers to Discharge: Continued Medical Work up  Expected Discharge Plan and Services Expected Discharge Plan: Skilled Nursing Facility     Post Acute Care Choice: Skilled Nursing Facility Living arrangements for the past 2 months: Skilled Nursing Facility                                       Social Determinants of Health (SDOH) Interventions    Readmission Risk Interventions No flowsheet data found.

## 2019-11-06 NOTE — Discharge Summary (Addendum)
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 IVC diam: 1.20 cm LEFT ATRIUM             Index LA diam:        1.90 cm 0.87 cm/m LA Vol (A2C):   44.3 ml 20.20 ml/m LA Vol (A4C):   29.5 ml 13.45 ml/m LA Biplane Vol: 36.6 ml 16.69 ml/m  AORTIC VALVE AV Area (Vmax):    3.07 cm AV Area (Vmean):   2.82 cm AV Area (VTI):     2.74 cm AV Vmax:           131.00 cm/s AV Vmean:          85.800 cm/s AV VTI:            0.174 m AV Peak Grad:      6.9 mmHg AV Mean Grad:      3.0 mmHg LVOT Vmax:         128.00 cm/s LVOT Vmean:        77.000 cm/s LVOT VTI:          0.152 m LVOT/AV VTI ratio: 0.87  AORTA Ao Root diam: 3.20 cm Ao Asc diam:  3.20 cm  SHUNTS Systemic VTI:  0.15 m Systemic Diam: 2.00 cm Zoila Shutter MD Electronically signed by Zoila Shutter MD Signature Date/Time: 10/30/2019/5:23:49 PM    Final    [place] EKG  SITE RITE  Result Date: 10/31/2019 If Site Rite image not attached, placement could not be confirmed due to current cardiac rhythm.  DG FL GUIDED LUMBAR PUNCTURE  Result Date: 10/31/2019 CLINICAL DATA:  34 year old male with history of seizures. EXAM: DIAGNOSTIC LUMBAR PUNCTURE UNDER FLUOROSCOPIC GUIDANCE  FLUOROSCOPY TIME:  Fluoroscopy Time:  30 second Radiation Exposure Index (if provided by the fluoroscopic device): 6.0 mGy PROCEDURE: Informed consent was obtained from the patient prior to the procedure, including potential complications of headache, allergy, and pain. With the patient prone, the lower back was prepped with Betadine. 1% Lidocaine was used for local anesthesia. Lumbar puncture was performed at the L2-L3 level using a 22 gauge needle with return of clear CSF with an opening pressure of 22 cm water. 13 ml of CSF were obtained for laboratory studies. The patient tolerated the procedure well and there were no apparent complications. IMPRESSION: 1. Successful uncomplicated fluoroscopic guided lumbar puncture, as above. Electronically Signed   By: Trudie Reed M.D.   On: 10/31/2019 16:18      Subjective:  Patient seen and examined at the bedside this morning. Hemodynamically stable for discharge today.  Discharge Exam: Vitals:   11/07/19 0416 11/07/19 0830  BP: (!) 130/87 120/80  Pulse: 92 101  Resp: 17 16  Temp: 98 F (36.7 C) 97.6 F (36.4 C)  SpO2: 100% 100%   Vitals:   11/07/19 0016 11/07/19 0416 11/07/19 0500 11/07/19 0830  BP: (!) 124/92 (!) 130/87  120/80  Pulse: 98 92  101  Resp: 17 17  16   Temp: 98.2 F (36.8 C) 98 F (36.7 C)  97.6 F (36.4 C)  TempSrc: Axillary Oral  Oral  SpO2: 98% 100%  100%  Weight:   (!) 100.8 kg   Height:        General: Pt is alert, awake, not in acute distress Cardiovascular: RRR, S1/S2 +, no rubs, no gallops Respiratory: CTA bilaterally, no wheezing, no rhonchi Abdominal: Soft, NT, ND, bowel sounds + Extremities: no edema, no cyanosis    The results of significant diagnostics from this hospitalization (including imaging, microbiology, ancillary and laboratory) are listed below for reference.     Microbiology: Recent Results (from the past 240 hour(s))  Culture, blood (routine x 2)     Status: None   Collection Time:  10/29/19  9:35 AM   Specimen: BLOOD RIGHT HAND  Result Value Ref Range Status   Specimen Description BLOOD RIGHT HAND  Final   Special Requests   Final    BOTTLES DRAWN AEROBIC AND ANAEROBIC Blood Culture adequate volume   Culture   Final    NO GROWTH 5 DAYS Performed at [facility], [address], [place], [place] [address]    Report Status 11/03/2019 FINAL  Final  SARS Coronavirus 2 by RT PCR (hospital order, performed in [facility] hospital lab) Nasopharyngeal Nasopharyngeal Swab     Status: None   Collection Time: 10/29/19  9:38 AM   Specimen: Nasopharyngeal Swab  Result Value Ref Range Status   SARS Coronavirus 2 NEGATIVE NEGATIVE Final    Comment: (NOTE) SARS-CoV-2 target nucleic acids are NOT DETECTED.  The SARS-CoV-2 RNA is generally detectable in upper and lower respiratory specimens during the acute phase of infection. The lowest concentration of SARS-CoV-2 viral copies this assay can detect is 250 copies / mL. A negative result does not preclude SARS-CoV-2 infection and should not be used as the sole basis for treatment or other patient management decisions.  A negative result may occur with improper specimen collection / handling, submission of specimen other than nasopharyngeal swab, presence of viral mutation(s) within the areas targeted by this assay, and inadequate number of viral copies (<250 copies / mL). A negative result must be combined with clinical observations, patient history, and epidemiological information.  Fact Sheet for Patients:   BoilerBrush.com.cy  Fact Sheet for Healthcare Providers: https://pope.com/  This test is not yet approved or  cleared by the [place] FDA and has been authorized for detection and/or diagnosis of SARS-CoV-2 by FDA under an Emergency Use Authorization (EUA).  This EUA will remain in effect (meaning this test can be used) for the duration of the COVID-19  declaration under Section 564(b)(1) of the Act, 21 U.S.C. section 360bbb-3(b)(1), unless the authorization is terminated or revoked sooner.  Performed at [facility], [address] [place], [place] [address]   Urine culture     Status: None   Collection Time: 10/29/19 10:04 AM   Specimen: Urine, Random  Result Value Ref Range Status   Specimen Description URINE, RANDOM  Final   Special Requests NONE  Final   Culture   Final    NO GROWTH Performed at [facility], [address] [place], [place] [address]    Report Status 10/30/2019 FINAL  Final  Culture, blood (routine x 2)     Status: None   Collection Time: 10/29/19 11:30 AM   Specimen: BLOOD RIGHT HAND  Result Value Ref Range Status   Specimen Description BLOOD RIGHT HAND  Final   Special Requests   Final    BOTTLES DRAWN AEROBIC AND ANAEROBIC Blood Culture adequate volume   Culture   Final    NO GROWTH 5 DAYS Performed at [facility], [address] [place], [place] [address]    Report Status 11/03/2019 FINAL  Final  MRSA PCR Screening     Status: Abnormal   Collection Time: 10/29/19  2:17 PM   Specimen: Nasopharyngeal  Result Value Ref Range Status   MRSA by PCR POSITIVE (A) NEGATIVE Final    Comment:        The GeneXpert MRSA Assay (FDA approved for NASAL specimens only), is one component of a comprehensive MRSA colonization surveillance program. It is not intended to diagnose MRSA infection nor to guide or monitor treatment for MRSA infections. RESULT CALLED TO, READ BACK BY AND VERIFIED WITH: RN Angela Cox [id number] FCP Performed at [facility], [address] [place], [place] [address]   Culture, respiratory (non-expectorated)     Status: None   Collection Time: 10/29/19  3:54 PM   Specimen: Tracheal Aspirate; Respiratory  Result Value Ref Range Status   Specimen Description TRACHEAL ASPIRATE  Final   Special Requests NONE  Final   Gram Stain   Final    ABUNDANT WBC  PRESENT, PREDOMINANTLY PMN FEW GRAM POSITIVE COCCI IN PAIRS IN CHAINS    Culture   Final    Consistent with normal respiratory flora. Performed at [facility], [address] [place], [place] [address]    Report Status 10/31/2019 FINAL  Final  CSF culture     Status: None   Collection Time: 10/31/19  3:20 PM   Specimen: PATH Cytology CSF; Cerebrospinal Fluid  Result Value Ref Range Status   Specimen Description CSF  Final   Special Requests NONE  Final   Gram Stain   Final    WBC  PRESENT,BOTH PMN AND MONONUCLEAR NO ORGANISMS SEEN CYTOSPIN SMEAR    Culture   Final    NO GROWTH 3 DAYS Performed at [facility], [address], [place], [place] [address]    Report Status 11/03/2019 FINAL  Final  SARS Coronavirus 2 by RT PCR (hospital order, performed in [facility] hospital lab) Nasopharyngeal Nasopharyngeal Swab     Status: None   Collection Time: 11/06/19 12:09 PM   Specimen: Nasopharyngeal Swab  Result Value Ref Range Status   SARS Coronavirus 2 NEGATIVE NEGATIVE Final    Comment: (NOTE) SARS-CoV-2 target nucleic acids are NOT DETECTED.  The SARS-CoV-2 RNA is generally detectable in upper and lower respiratory specimens during the acute phase of infection. The lowest concentration of SARS-CoV-2 viral copies this assay can detect is 250 copies / mL. A negative result does not preclude SARS-CoV-2 infection and should not be used as the sole basis for treatment or other patient management decisions.  A negative result may occur with improper specimen collection / handling, submission of specimen other than nasopharyngeal swab, presence of viral mutation(s) within the areas targeted by this assay, and inadequate number of viral copies (<250 copies / mL). A negative result must be combined with clinical observations, patient history, and epidemiological information.  Fact Sheet for Patients:   BoilerBrush.com.cy  Fact Sheet for Healthcare  Providers: https://pope.com/  This test is not yet approved or  cleared by the [place] FDA and has been authorized for detection and/or diagnosis of SARS-CoV-2 by FDA under an Emergency Use Authorization (EUA).  This EUA will remain in effect (meaning this test can be used) for the duration of the COVID-19 declaration under Section 564(b)(1) of the Act, 21 U.S.C. section 360bbb-3(b)(1), unless the authorization is terminated or revoked sooner.  Performed at [facility] Lab, [address] [place], [place] [address]      Labs: BNP (last 3 results) No results for input(s): BNP in the last 8760 hours. Basic Metabolic Panel: Recent Labs  Lab 11/01/19 0850 11/02/19 0452 11/03/19 0621 11/05/19 0430  NA 140 136 140 138  K 3.2* 3.2* 3.7 4.0  CL 107 106 107 103  CO2 24 21* 24 27  GLUCOSE 95 130* 122* 125*  BUN 10 10 8 12   CREATININE 0.88 0.72 0.71 0.76  CALCIUM 8.6* 8.5* 9.2 9.2  MG  --  1.9 2.0  --   PHOS  --  3.2 3.5  --    Liver Function Tests: No results for input(s): AST, ALT, ALKPHOS, BILITOT, PROT, ALBUMIN in the last 168 hours. No results for input(s): LIPASE, AMYLASE in the last 168 hours. No results for input(s): AMMONIA in the last 168 hours. CBC: Recent Labs  Lab 11/01/19 0850 11/02/19 0452 11/03/19 0621  WBC 9.6 10.9* 10.0  HGB 12.4* 11.4* 12.3*  HCT 39.5 35.8* 39.3  MCV 82.5 81.4 83.1  PLT 313 304 356   Cardiac Enzymes: No results for input(s): CKTOTAL, CKMB, CKMBINDEX, TROPONINI in the last 168 hours. BNP: Invalid input(s): POCBNP CBG: Recent Labs  Lab 11/06/19 1617 11/06/19 1957 11/07/19 0022 11/07/19 0532 11/07/19 0830  GLUCAP 103* 120* 102* 99 136*   D-Dimer No results for input(s): DDIMER in the last 72 hours. Hgb A1c No results for input(s): HGBA1C in the last 72 hours. Lipid Profile No results for input(s): CHOL, HDL, LDLCALC, TRIG, CHOLHDL, LDLDIRECT in the last 72 hours. Thyroid function  studies No results for input(s): TSH, T4TOTAL, T3FREE, THYROIDAB in the  last 72 hours.  Invalid input(s): FREET3 Anemia work up No results for input(s): VITAMINB12, FOLATE, FERRITIN, TIBC, IRON, RETICCTPCT in the last 72 hours. Urinalysis    Component Value Date/Time   COLORURINE AMBER (A) 10/29/2019 0938   APPEARANCEUR CLOUDY (A) 10/29/2019 0938   LABSPEC 1.021 10/29/2019 0938   PHURINE 5.0 10/29/2019 0938   GLUCOSEU NEGATIVE 10/29/2019 0938   HGBUR LARGE (A) 10/29/2019 0938   BILIRUBINUR NEGATIVE 10/29/2019 0938   KETONESUR NEGATIVE 10/29/2019 0938   PROTEINUR 100 (A) 10/29/2019 0938   NITRITE NEGATIVE 10/29/2019 0938   LEUKOCYTESUR NEGATIVE 10/29/2019 0938   Sepsis Labs Invalid input(s): PROCALCITONIN,  WBC,  LACTICIDVEN Microbiology Recent Results (from the past 240 hour(s))  Culture, blood (routine x 2)     Status: None   Collection Time: 10/29/19  9:35 AM   Specimen: BLOOD RIGHT HAND  Result Value Ref Range Status   Specimen Description BLOOD RIGHT HAND  Final   Special Requests   Final    BOTTLES DRAWN AEROBIC AND ANAEROBIC Blood Culture adequate volume   Culture   Final    NO GROWTH 5 DAYS Performed at [facility], [address] [place], [place] [address]    Report Status 11/03/2019 FINAL  Final  SARS Coronavirus 2 by RT PCR (hospital order, performed in [facility] hospital lab) Nasopharyngeal Nasopharyngeal Swab     Status: None   Collection Time: 10/29/19  9:38 AM   Specimen: Nasopharyngeal Swab  Result Value Ref Range Status   SARS Coronavirus 2 NEGATIVE NEGATIVE Final    Comment: (NOTE) SARS-CoV-2 target nucleic acids are NOT DETECTED.  The SARS-CoV-2 RNA is generally detectable in upper and lower respiratory specimens during the acute phase of infection. The lowest concentration of SARS-CoV-2 viral copies this assay can detect is 250 copies / mL. A negative result does not preclude SARS-CoV-2 infection and should not be used as the sole basis  for treatment or other patient management decisions.  A negative result may occur with improper specimen collection / handling, submission of specimen other than nasopharyngeal swab, presence of viral mutation(s) within the areas targeted by this assay, and inadequate number of viral copies (<250 copies / mL). A negative result must be combined with clinical observations, patient history, and epidemiological information.  Fact Sheet for Patients:   BoilerBrush.com.cyhttps://www.fda.gov/media/136312/download  Fact Sheet for Healthcare Providers: https://pope.com/https://www.fda.gov/media/136313/download  This test is not yet approved or  cleared by the [place] FDA and has been authorized for detection and/or diagnosis of SARS-CoV-2 by FDA under an Emergency Use Authorization (EUA).  This EUA will remain in effect (meaning this test can be used) for the duration of the COVID-19 declaration under Section 564(b)(1) of the Act, 21 U.S.C. section 360bbb-3(b)(1), unless the authorization is terminated or revoked sooner.  Performed at [facility], [address] [place], [place] [address]   Urine culture     Status: None   Collection Time: 10/29/19 10:04 AM   Specimen: Urine, Random  Result Value Ref Range Status   Specimen Description URINE, RANDOM  Final   Special Requests NONE  Final   Culture   Final    NO GROWTH Performed at [facility], [address] [place], [place] [address]    Report Status 10/30/2019 FINAL  Final  Culture, blood (routine x 2)     Status: None   Collection Time: 10/29/19 11:30 AM   Specimen: BLOOD RIGHT HAND  Result Value Ref Range Status  Specimen Description BLOOD RIGHT HAND  Final   Special Requests   Final    BOTTLES DRAWN AEROBIC AND ANAEROBIC Blood Culture adequate volume   Culture   Final    NO GROWTH 5 DAYS Performed at [facility], [address] [place], [place] [address]    Report Status 11/03/2019 FINAL  Final  MRSA PCR Screening     Status:  Abnormal   Collection Time: 10/29/19  2:17 PM   Specimen: Nasopharyngeal  Result Value Ref Range Status   MRSA by PCR POSITIVE (A) NEGATIVE Final    Comment:        The GeneXpert MRSA Assay (FDA approved for NASAL specimens only), is one component of a comprehensive MRSA colonization surveillance program. It is not intended to diagnose MRSA infection nor to guide or monitor treatment for MRSA infections. RESULT CALLED TO, READ BACK BY AND VERIFIED WITH: RN Angela Cox [id number] FCP Performed at [facility], [address] [place], [place] [address]   Culture, respiratory (non-expectorated)     Status: None   Collection Time: 10/29/19  3:54 PM   Specimen: Tracheal Aspirate; Respiratory  Result Value Ref Range Status   Specimen Description TRACHEAL ASPIRATE  Final   Special Requests NONE  Final   Gram Stain   Final    ABUNDANT WBC PRESENT, PREDOMINANTLY PMN FEW GRAM POSITIVE COCCI IN PAIRS IN CHAINS    Culture   Final    Consistent with normal respiratory flora. Performed at [facility], [address] [place], [place] [address]    Report Status 10/31/2019 FINAL  Final  CSF culture     Status: None   Collection Time: 10/31/19  3:20 PM   Specimen: PATH Cytology CSF; Cerebrospinal Fluid  Result Value Ref Range Status   Specimen Description CSF  Final   Special Requests NONE  Final   Gram Stain   Final    WBC PRESENT,BOTH PMN AND MONONUCLEAR NO ORGANISMS SEEN CYTOSPIN SMEAR    Culture   Final    NO GROWTH 3 DAYS Performed at [facility] Lab, [address] [place], [place] [address]    Report Status 11/03/2019 FINAL  Final  SARS Coronavirus 2 by RT PCR (hospital order, performed in [facility] hospital lab) Nasopharyngeal Nasopharyngeal Swab     Status: None   Collection Time: 11/06/19 12:09 PM   Specimen: Nasopharyngeal Swab  Result Value Ref Range Status   SARS Coronavirus 2 NEGATIVE NEGATIVE Final    Comment: (NOTE) SARS-CoV-2 target nucleic  acids are NOT DETECTED.  The SARS-CoV-2 RNA is generally detectable in upper and lower respiratory specimens during the acute phase of infection. The lowest concentration of SARS-CoV-2 viral copies this assay can detect is 250 copies / mL. A negative result does not preclude SARS-CoV-2 infection and should not be used as the sole basis for treatment or other patient management decisions.  A negative result may occur with improper specimen collection / handling, submission of specimen other than nasopharyngeal swab, presence of viral mutation(s) within the areas targeted by this assay, and inadequate number of viral copies (<250 copies / mL). A negative result must be combined with clinical observations, patient history, and epidemiological information.  Fact Sheet for Patients:   BoilerBrush.com.cy  Fact Sheet for Healthcare Providers: https://pope.com/  This test is not yet approved or  cleared by the [place] FDA and has been authorized for detection and/or diagnosis of SARS-CoV-2 by FDA  under an Emergency Use Authorization (EUA).  This EUA will remain in effect (meaning this test can be used) for the duration of the COVID-19 declaration under Section 564(b)(1) of the Act, 21 U.S.C. section 360bbb-3(b)(1), unless the authorization is terminated or revoked sooner.  Performed at [facility], [address], [place], [place] [address]     Please note: You were cared for by a hospitalist during your hospital stay. Once you are discharged, your primary care physician will handle any further medical issues. Please note that NO REFILLS for any discharge medications will be authorized once you are discharged, as it is imperative that you return to your primary care physician (or establish a relationship with a primary care physician if you do not have one) for your post hospital discharge needs so that they can reassess your need  for medications and monitor your lab values.    Time coordinating discharge: 40 minutes  SIGNED:   Burnadette Pop, MD  [facility] 11/07/2019, 9:37 AM Pager [phone]  If 7PM-7AM, please contact night-coverage [email or web address] Password [email or web address]

## 2019-11-06 NOTE — Progress Notes (Signed)
PICC line to be removed prior to discharge to SNF per Dr. Renford Dills, MD.  Order placed for PICC line to be removed on 11/07/19 prior to discharge.

## 2019-11-07 LAB — GLUCOSE, CAPILLARY
Glucose-Capillary: 102 mg/dL — ABNORMAL HIGH (ref 70–99)
Glucose-Capillary: 136 mg/dL — ABNORMAL HIGH (ref 70–99)
Glucose-Capillary: 99 mg/dL (ref 70–99)

## 2019-11-07 MED ORDER — OXYCODONE HCL 5 MG PO TABS: 15.0000 mg | ORAL_TABLET | Freq: Four times a day (QID) | ORAL | 0 refills | Status: AC | PRN

## 2019-11-07 NOTE — Progress Notes (Signed)
Discharged to [facility].  Report called to nurse Revonda Standard and mom Karena Addison notified of transfer back.  Transported by [facility].

## 2019-11-07 NOTE — Progress Notes (Signed)
IV team called RN to inquire about removed PICC. RN was asked if catheter tip ended with 40 which was confirmed with second RN Sonya. Pt catheter noted to be at 40. Per IV team staff, pt removed catheter ok as tip was noted to be at 40. Will continue to closely monitor. Matthew Merles Omarion Minnehan RN.

## 2019-11-07 NOTE — Progress Notes (Signed)
Patient seen and examined at the bedside today.    Discharge orders and summary are in place.  No new change in the management from yesterday.  Please stable for discharge to skilled nursing facility today.

## 2019-11-07 NOTE — Progress Notes (Signed)
RN went to pt room and assessed pt to have pulled out his SL PICC laying top of him in bed. Catheter intact but RN unsure if tip intact or not. IV team page and notified for assessment of catheter. Dionne Bucy RN

## 2019-11-07 NOTE — TOC Transition Note (Signed)
Transition of Care [facility]) - CM/SW Discharge Note   Patient Details  Name: Matthew Glenn MRN: [id number] Date of Birth: 1985/07/15  [facility] CM/SW Contact:  Baldemar Lenis, LCSW Phone Number: 11/07/2019, 9:11 AM   Clinical Narrative:   Nurse to call report to [phone]    Final next level of care: Skilled Nursing Facility Barriers to Discharge: Barriers Resolved   Patient Goals and CMS Choice Patient states their goals for this hospitalization and ongoing recovery are:: patient unable to participate in goal setting due to disorientation CMS Medicare.gov Compare Post Acute Care list provided to:: Patient Represenative (must comment) Choice offered to / list presented to : Parent  Discharge Placement              Patient chooses bed at: [facility] Patient to be transferred to facility by: [facility] Name of family member notified: Mom Patient and family notified of of transfer: 11/07/19  Discharge Plan and Services     Post Acute Care Choice: Skilled Nursing Facility                               Social Determinants of Health (SDOH) Interventions     Readmission Risk Interventions No flowsheet data found.

## 2019-12-29 ENCOUNTER — Ambulatory Visit: Payer: Medicaid Other | Admitting: Neurology

## 2019-12-29 ENCOUNTER — Encounter: Payer: Self-pay | Admitting: Neurology

## 2021-03-27 ENCOUNTER — Emergency Department (HOSPITAL_COMMUNITY): Payer: Medicaid Other

## 2021-03-27 ENCOUNTER — Other Ambulatory Visit: Payer: Self-pay

## 2021-03-27 ENCOUNTER — Encounter (HOSPITAL_COMMUNITY): Payer: Self-pay | Admitting: Oncology

## 2021-03-27 ENCOUNTER — Emergency Department (HOSPITAL_COMMUNITY)
Admission: EM | Admit: 2021-03-27 | Discharge: 2021-03-28 | Disposition: A | Discharge status: Home / Self Care | Payer: Medicaid Other | Attending: Student | Admitting: Student

## 2021-03-27 DIAGNOSIS — R519 Headache, unspecified: Secondary | ICD-10-CM | POA: Diagnosis present

## 2021-03-27 DIAGNOSIS — R531 Weakness: Secondary | ICD-10-CM | POA: Diagnosis not present

## 2021-03-27 DIAGNOSIS — W050XXA Fall from non-moving wheelchair, initial encounter: Secondary | ICD-10-CM | POA: Diagnosis not present

## 2021-03-27 DIAGNOSIS — Z79899 Other long term (current) drug therapy: Secondary | ICD-10-CM | POA: Insufficient documentation

## 2021-03-27 DIAGNOSIS — Y92129 Unspecified place in nursing home as the place of occurrence of the external cause: Secondary | ICD-10-CM | POA: Diagnosis not present

## 2021-03-27 DIAGNOSIS — W19XXXA Unspecified fall, initial encounter: Secondary | ICD-10-CM

## 2021-03-27 DIAGNOSIS — M542 Cervicalgia: Secondary | ICD-10-CM | POA: Insufficient documentation

## 2021-03-27 DIAGNOSIS — I10 Essential (primary) hypertension: Secondary | ICD-10-CM | POA: Diagnosis not present

## 2021-03-27 NOTE — ED Triage Notes (Signed)
Pt bib [facility] from [facility] s/p fall.  Pt is quadriplegic, was leaning forward in wheelchair when he fell face first out of it.  Pt did hit his head, no LOC/anticoagulants.  Denies pain.  MD at SNF wanted pt evaluated.
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
## 2021-03-28 NOTE — ED Notes (Signed)
[facility] called for pt. Transport back to [facility].

## 2021-03-28 NOTE — ED Notes (Signed)
[facility] contacted to check status of patient transport

## 2021-08-21 IMAGING — RF DG SPINAL PUNCT LUMBAR DIAG WITH FL CT GUIDANCE
5 series · 5 of 5 positions shown · non-contrast
Comparison: none

CLINICAL DATA: 34-year-old male with history of seizures.

[Series 1: fluoro_iodine 2fps_bw · 0.17mm/px · 1 of 1 slices shown]
[im 1/1]
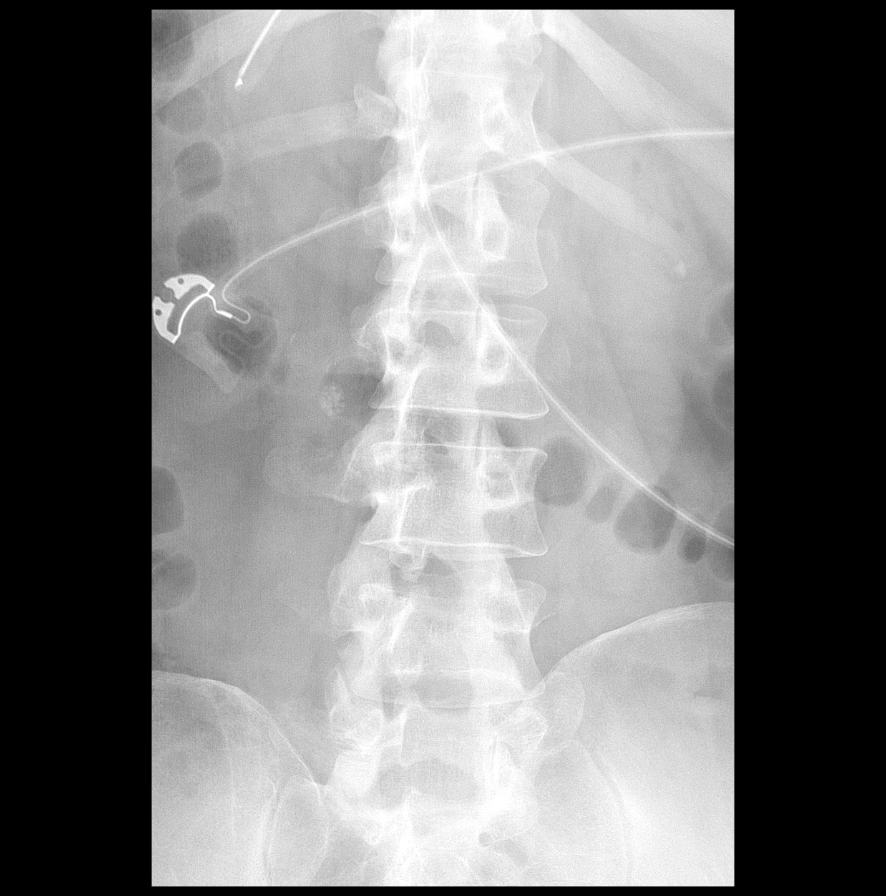

[Series 2: cp_standard · 0.25mm/px · 1 of 1 slices shown (1 of 4)]
[im 1/1]
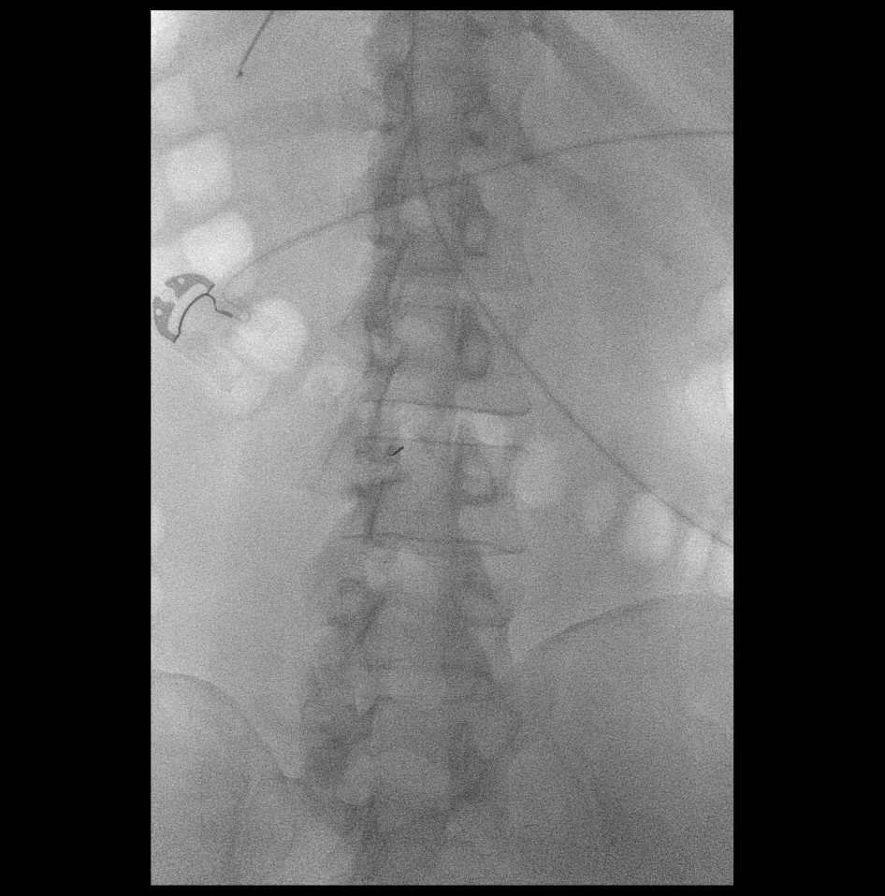

[Series 3: cp_standard · 0.25mm/px · 1 of 1 slices shown (2 of 4)]
[im 1/1]
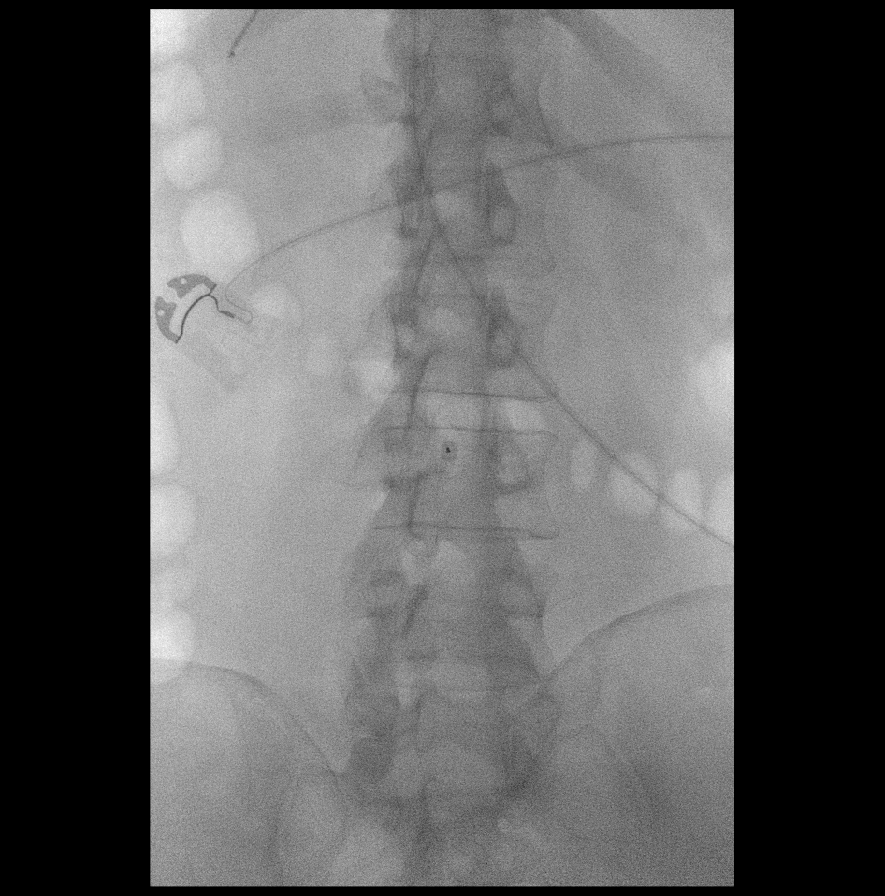

[Series 4: cp_standard · 0.25mm/px · 1 of 1 slices shown (3 of 4)]
[im 1/1]
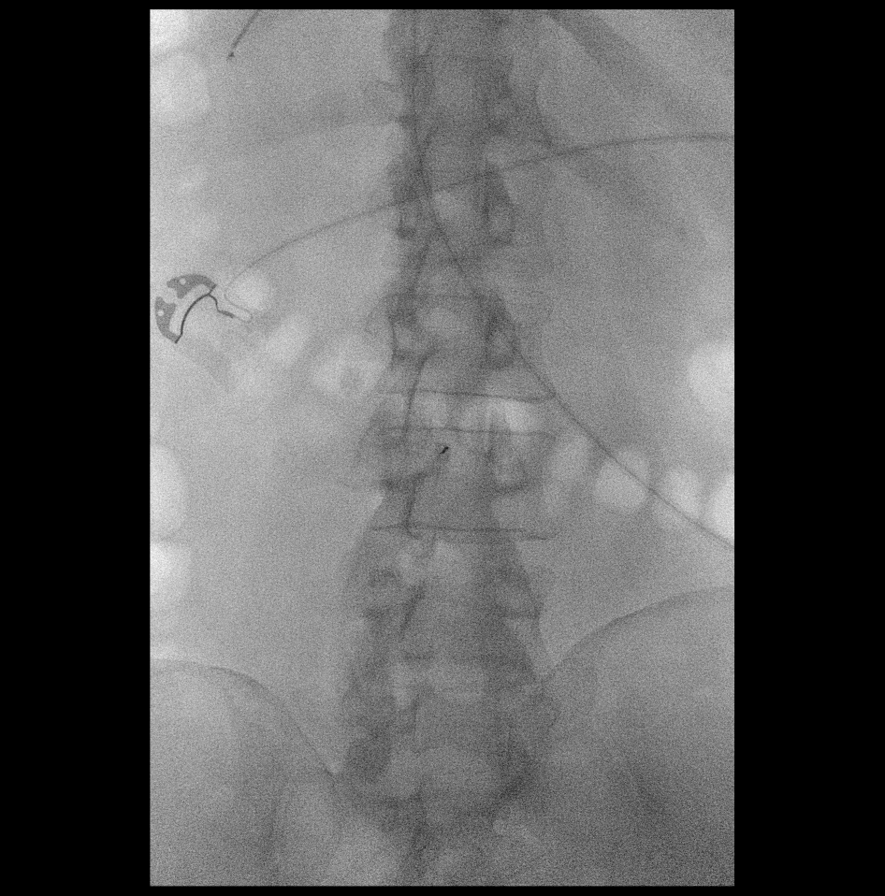

[Series 5: cp_standard · 0.25mm/px · 1 of 1 slices shown (4 of 4)]
[im 1/1]
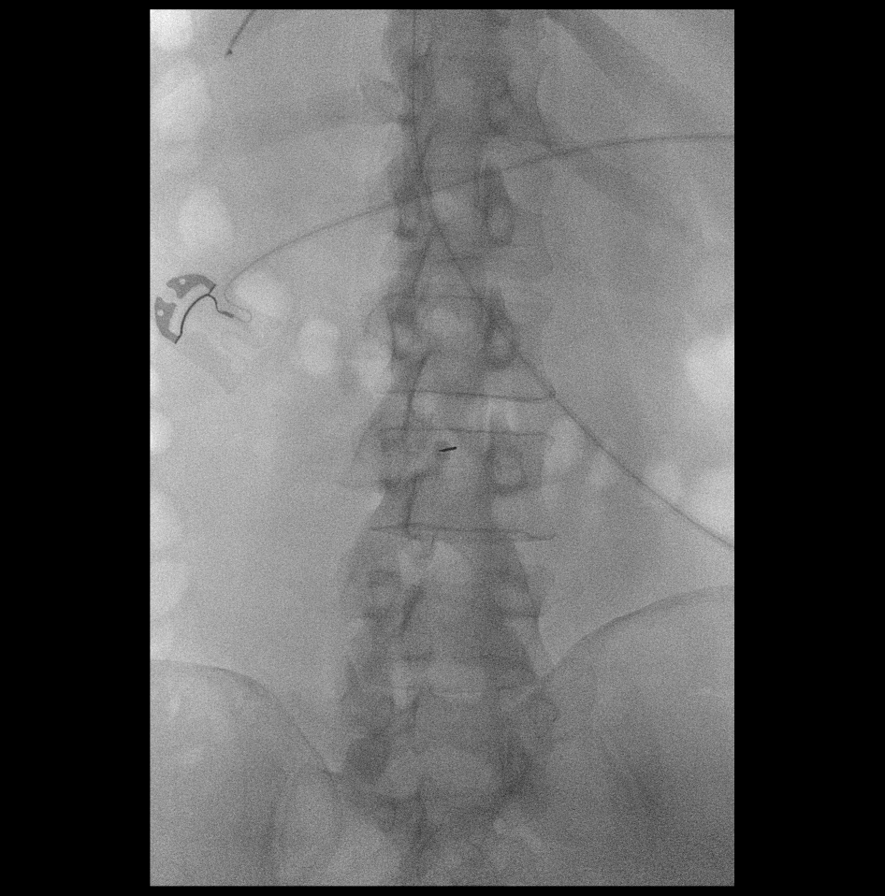

[5 of 5 positions shown; findings below may reference images not displayed]

EXAM:
DIAGNOSTIC LUMBAR PUNCTURE UNDER FLUOROSCOPIC GUIDANCE

FLUOROSCOPY TIME:  Fluoroscopy Time:  30 second

Radiation Exposure Index (if provided by the fluoroscopic device):
6.0 mGy

PROCEDURE:
Informed consent was obtained from the patient prior to the
procedure, including potential complications of headache, allergy,
and pain. With the patient prone, the lower back was prepped with
Betadine. 1% Lidocaine was used for local anesthesia. Lumbar
puncture was performed at the L2-L3 level using a 22 gauge needle
with return of clear CSF with an opening pressure of 22 cm water. 13
ml of CSF were obtained for laboratory studies. The patient
tolerated the procedure well and there were no apparent
complications.
IMPRESSION: 1. Successful uncomplicated fluoroscopic guided lumbar puncture, as
above.
